# Patient Record
Sex: Male | Born: 1990
Health system: Southern US, Community
[De-identification: ages and names within clinical notes are randomized; demographics above are authoritative.]

---
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
## 2012-03-19 ENCOUNTER — Encounter (HOSPITAL_COMMUNITY): Payer: Self-pay | Admitting: *Deleted

## 2012-03-19 DIAGNOSIS — Z0389 Encounter for observation for other suspected diseases and conditions ruled out: Secondary | ICD-10-CM | POA: Insufficient documentation

## 2012-03-19 DIAGNOSIS — R3919 Other difficulties with micturition: Secondary | ICD-10-CM | POA: Insufficient documentation

## 2012-03-19 NOTE — ED Notes (Signed)
Pt states he has seen blood in urine for past 4 days, voids red in the morning after waking up and urine changes to yellow color throughout the day.  Denies urinary pain, foul odor, abdominal pain, n/v/d.

## 2012-03-20 ENCOUNTER — Emergency Department (HOSPITAL_COMMUNITY)
Admission: EM | Admit: 2012-03-20 | Discharge: 2012-03-20 | Disposition: A | Discharge status: Home / Self Care | Payer: Medicaid Other | Attending: Emergency Medicine | Admitting: Emergency Medicine

## 2012-03-20 DIAGNOSIS — Z711 Person with feared health complaint in whom no diagnosis is made: Secondary | ICD-10-CM

## 2012-03-20 LAB — URINALYSIS, ROUTINE W REFLEX MICROSCOPIC
Ketones, ur: NEGATIVE mg/dL
Nitrite: NEGATIVE
Protein, ur: NEGATIVE mg/dL
Specific Gravity, Urine: 1.007 (ref 1.005–1.030)
Urobilinogen, UA: 1 mg/dL (ref 0.0–1.0)
pH: 7.5 (ref 5.0–8.0)

## 2012-03-20 NOTE — ED Provider Notes (Signed)
History     CSN: [id number]  Arrival date & time 03/19/12  2325   First MD Initiated Contact with Patient 03/20/12 0038      Chief Complaint  Patient presents with  . Hematuria    (Consider location/radiation/quality/duration/timing/severity/associated sxs/prior treatment) Patient is a 21 y.o. male presenting with hematuria. The history is provided by the patient.  Hematuria  Patient presents to ER complaining of a few day hx of either dark orange colored urine when waking or  waking with blood in urine with first urination. However patient states that urine becomes lighter throughout the day with drinking fluids. He denies any associated pain denying flank pain, abdominal pain, dysuria, penile pain, penile d/c or testicular pain. Patient states he is not sexually active. Has no known medical problems and takes no meds on regular basis. Denies fevers, chills. Denies aggravating or alleviating factors. Denies hx of similar symptoms.   History reviewed. No pertinent past medical history.  History reviewed. No pertinent past surgical history.  History reviewed. No pertinent family history.  History  Substance Use Topics  . Smoking status: Never Smoker   . Smokeless tobacco: Not on file  . Alcohol Use: No      Review of Systems  Genitourinary: Positive for hematuria.  All other systems reviewed and are negative.    Allergies  Review of patient's allergies indicates no known allergies.  Home Medications   Current Outpatient Rx  Name Route Sig Dispense Refill  . IBUPROFEN 200 MG PO TABS Oral Take 400-600 mg by mouth every 6 (six) hours as needed. For pain      BP 145/46  Pulse 68  Temp 98.7 F (37.1 C) (Oral)  Resp 16  SpO2 100%  Physical Exam  Nursing note and vitals reviewed. Constitutional: He is oriented to person, place, and time. He appears well-developed and well-nourished. No distress.  HENT:  Head: Normocephalic and atraumatic.  Eyes: Conjunctivae are  normal.  Cardiovascular: Normal rate, regular rhythm, normal heart sounds and intact distal pulses.  Exam reveals no gallop and no friction rub.   No murmur heard. Pulmonary/Chest: Effort normal and breath sounds normal. No respiratory distress. He has no wheezes. He has no rales. He exhibits no tenderness.  Abdominal: Soft. Bowel sounds are normal. He exhibits no distension and no mass. There is no tenderness. There is no rebound and no guarding.       No CVA TTP.   Genitourinary: Penis normal. No penile tenderness.       No teste pain with palpation  Neurological: He is alert and oriented to person, place, and time.  Skin: Skin is warm and dry. No rash noted. He is not diaphoretic.  Psychiatric: He has a normal mood and affect.    ED Course  Procedures (including critical care time)   Labs Reviewed  URINALYSIS, ROUTINE W REFLEX MICROSCOPIC   No results found.   1. Physically well but worried       MDM  Young healthy male who is unsure whether he is seeing highly concentrated urine upon waking over the last three days or has blood in urine but UA shows no evidence of blood. He has no other complaints denying any associated pain making kidney stones of low likelihood. Gave urology follow up for further evaluation if he has ongoing concern for color of urine vs hematuria but I can only assume he is seeing more concentrated urine in the mornings. Afebrile. Abdomen soft and non tender with no  complaints of penile d/c, penile pain, or teste pain.         [place], [place] 03/20/12 [phone]

## 2012-03-20 NOTE — ED Provider Notes (Signed)
Medical screening examination/treatment/procedure(s) were performed by non-physician practitioner and as supervising physician I was immediately available for consultation/collaboration.  Olivia Mackie, MD 03/20/12 [phone]

## 2012-03-20 NOTE — ED Notes (Signed)
Pt discharged home in good condition. 

## 2012-07-13 ENCOUNTER — Encounter (HOSPITAL_COMMUNITY): Payer: Self-pay | Admitting: Emergency Medicine

## 2012-07-13 ENCOUNTER — Emergency Department (HOSPITAL_COMMUNITY): Payer: Medicaid Other

## 2012-07-13 ENCOUNTER — Emergency Department (HOSPITAL_COMMUNITY)
Admission: EM | Admit: 2012-07-13 | Discharge: 2012-07-13 | Disposition: A | Discharge status: Home / Self Care | Payer: Medicaid Other | Attending: Emergency Medicine | Admitting: Emergency Medicine

## 2012-07-13 DIAGNOSIS — K8020 Calculus of gallbladder without cholecystitis without obstruction: Secondary | ICD-10-CM | POA: Insufficient documentation

## 2012-07-13 DIAGNOSIS — I951 Orthostatic hypotension: Secondary | ICD-10-CM | POA: Insufficient documentation

## 2012-07-13 DIAGNOSIS — J069 Acute upper respiratory infection, unspecified: Secondary | ICD-10-CM | POA: Insufficient documentation

## 2012-07-13 LAB — CBC WITH DIFFERENTIAL/PLATELET
Basophils Absolute: 0 10*3/uL (ref 0.0–0.1)
Basophils Relative: 0 % (ref 0–1)
Eosinophils Absolute: 0 10*3/uL (ref 0.0–0.7)
HCT: 43.4 % (ref 39.0–52.0)
MCH: 29.4 pg (ref 26.0–34.0)
RBC: 5.3 MIL/uL (ref 4.22–5.81)
RDW: 12.2 % (ref 11.5–15.5)
WBC: 6.3 10*3/uL (ref 4.0–10.5)

## 2012-07-13 LAB — URINALYSIS, ROUTINE W REFLEX MICROSCOPIC
Bilirubin Urine: NEGATIVE
Ketones, ur: NEGATIVE mg/dL
Leukocytes, UA: NEGATIVE
Protein, ur: NEGATIVE mg/dL
Specific Gravity, Urine: 1.015 (ref 1.005–1.030)
pH: 6 (ref 5.0–8.0)

## 2012-07-13 LAB — COMPREHENSIVE METABOLIC PANEL
ALT: 21 U/L (ref 0–53)
AST: 22 U/L (ref 0–37)
Alkaline Phosphatase: 72 U/L (ref 39–117)
CO2: 25 mEq/L (ref 19–32)
Chloride: 101 mEq/L (ref 96–112)
GFR calc Af Amer: >90 mL/min (ref >90)
GFR calc non Af Amer: 89 mL/min — ABNORMAL LOW (ref >90)
Glucose, Bld: 101 mg/dL — ABNORMAL HIGH (ref 70–99)
Potassium: 4 mEq/L (ref 3.5–5.1)
Sodium: 137 mEq/L (ref 135–145)
Total Bilirubin: 0.4 mg/dL (ref 0.3–1.2)

## 2012-07-13 LAB — LIPASE, BLOOD: Lipase: 48 U/L (ref 11–59)

## 2012-07-13 MED ORDER — SODIUM CHLORIDE 0.9 % IV BOLUS (SEPSIS)
1000.0000 mL | Freq: Once | INTRAVENOUS | Status: DC
  Administered 2012-07-13: 1000 mL via INTRAVENOUS

## 2012-07-13 MED ORDER — SODIUM CHLORIDE 0.9 % IV BOLUS (SEPSIS)
1000.0000 mL | Freq: Once | INTRAVENOUS | Status: AC
  Administered 2012-07-13: 1000 mL via INTRAVENOUS

## 2012-07-13 MED ORDER — AZITHROMYCIN 250 MG PO TABS: 250.0000 mg | ORAL_TABLET | Freq: Every day | ORAL | Status: DC

## 2012-07-13 MED ORDER — BENZONATATE 100 MG PO CAPS: 100.0000 mg | ORAL_CAPSULE | Freq: Three times a day (TID) | ORAL | Status: DC

## 2012-07-13 NOTE — ED Provider Notes (Signed)
History     CSN: [id number]  Arrival date & time 07/13/12  4098   First MD Initiated Contact with Patient 07/13/12 [phone]      Chief Complaint  Patient presents with  . Abdominal Pain    (Consider location/radiation/quality/duration/timing/severity/associated sxs/prior treatment) HPI  Pt to the ER by dad for abdominal pain that was acute onset starting this morning. He admits that by arriving to the ER he is no longer having abdominal pain but tells me he feels really dizzy even lying down still. He has not eaten this morning. He denies having any chest pain but has had a cough and fevers. His younger brother was recently diagnosed with pneumonia. Pt is nad but appears to be dry on exam.  History reviewed. No pertinent past medical history.  History reviewed. No pertinent past surgical history.  No family history on file.  History  Substance Use Topics  . Smoking status: Never Smoker   . Smokeless tobacco: Not on file  . Alcohol Use: No      Review of Systems  Review of Systems  Gen: no weight loss, chills, night sweats , +  fevers Eyes: no discharge or drainage, no occular pain or visual changes  Nose: no epistaxis or rhinorrhea  Mouth: no dental pain, no sore throat  Neck: no neck pain  Lungs:No wheezing,  or hemoptysis +coughing CV: no chest pain, palpitations, dependent edema or orthopnea  Abd: +abdominal pain, no nausea, vomiting  GU: no dysuria or gross hematuria  MSK:  No abnormalities  Neuro: no headache, no focal neurologic deficits  Skin: no abnormalities Psyche: negative.   Allergies  Review of patient's allergies indicates no known allergies.  Home Medications   Current Outpatient Rx  Name  Route  Sig  Dispense  Refill  . IBUPROFEN 200 MG PO TABS   Oral   Take 400 mg by mouth every 6 (six) hours as needed. For pain           BP 140/71  Pulse 88  Temp 100.4 F (38 C) (Rectal)  Resp 24  Wt 210 lb (95.255 kg)  SpO2 98%  Physical Exam    Nursing note and vitals reviewed. Constitutional: He appears well-developed and well-nourished. No distress.  HENT:  Head: Normocephalic and atraumatic.  Eyes: Pupils are equal, round, and reactive to light.  Neck: Normal range of motion. Neck supple.  Cardiovascular: Normal rate and regular rhythm.   Pulmonary/Chest: Effort normal. No respiratory distress. He has wheezes. He has no rales. He exhibits no tenderness.  Abdominal: Soft. He exhibits no distension and no mass. There is no tenderness. There is no rebound and no guarding.  Neurological: He is alert.  Skin: Skin is warm and dry.    ED Course  Procedures (including critical care time)  Labs Reviewed  CBC WITH DIFFERENTIAL - Abnormal; Notable for the following:    Neutrophils Relative 79 (*)     Lymphocytes Relative 11 (*)     All other components within normal limits  COMPREHENSIVE METABOLIC PANEL - Abnormal; Notable for the following:    Glucose, Bld 101 (*)     GFR calc non Af Amer 89 (*)     All other components within normal limits  URINALYSIS, ROUTINE W REFLEX MICROSCOPIC  LIPASE, BLOOD   Dg Chest 2 View  07/13/2012  *RADIOLOGY REPORT*  Clinical Data: Cough and pneumonia  CHEST - 2 VIEW  Comparison: None.  Findings: The heart is upper normal in size  allowing for AP technique with low volumes.  Lungs are under aerated and grossly clear.  No pneumothorax.  No pleural effusion.  IMPRESSION: No active cardiopulmonary disease.   Original Report Authenticated By: Jolaine Click, M.D.    US Abdomen Complete  07/13/2012  *RADIOLOGY REPORT*  Clinical Data:  Epigastric pain  COMPLETE ABDOMINAL ULTRASOUND  Comparison:  None.  Findings:  Gallbladder:  Tiny year gallstones versus gallbladder polyps.  No gallbladder wall thickening or pericholecystic fluid.  Negative sonographic Murphy's sign.  Common bile duct:  Measures 3 mm.  Liver:  No focal lesion identified.  Within normal limits in parenchymal echogenicity.  IVC:  Appears  normal.  Pancreas:  Not visualized due to overlying bowel gas.  Spleen:  Measures 9.7 cm.  Right Kidney:  Measures 13.0 cm.  No mass or hydronephrosis.  Left Kidney:  Measures 13.3 cm.  No mass or hydronephrosis.  Abdominal aorta:  No aneurysm identified.  IMPRESSION: Possible tiny gallstones versus gallbladder polyps.  No evidence of acute cholecystitis.   Original Report Authenticated By: Charline Bills, M.D.      1. Orthostatic hypotension   2. Cholelithiasis   3. URI (upper respiratory infection)       MDM  Discussed results and plan with patient. His orthostatic hypertension resolved with 2L of fluids. Unsure why he was orthostatic, labs are reassuring, abd ultrasound shows small tiny gallbladder stones.  Referral to GenSurg for Cholelithiasis given Azithro and Tessalon perls given for URI  Pt given strict return to ER precautions. Discussed case with Dr. Bernette Mayers prior to dc  Pt has been advised of the symptoms that warrant their return to the ED. Patient has voiced understanding and has agreed to follow-up with the PCP or specialist.         Dorthula Matas, PA 07/13/12 1039

## 2012-07-13 NOTE — ED Notes (Signed)
Pt alert, arrives from home, c/o sharp gen abd pain, onset was this am, pt states he awoke to use restroom with onset, resp even unlabored, skin pwd, denies changes in bowel or bladder

## 2012-07-13 NOTE — ED Notes (Signed)
Returned from xray

## 2012-07-13 NOTE — ED Notes (Signed)
Patient transported to X-ray 

## 2012-07-20 NOTE — ED Provider Notes (Signed)
Medical screening examination/treatment/procedure(s) were performed by non-physician practitioner and as supervising physician I was immediately available for consultation/collaboration.   Shamell Hittle B. Bernette Mayers, MD 07/20/12 1527

## 2013-04-17 ENCOUNTER — Encounter (HOSPITAL_COMMUNITY): Payer: Self-pay | Admitting: *Deleted

## 2013-04-17 ENCOUNTER — Emergency Department (HOSPITAL_COMMUNITY)
Admission: EM | Admit: 2013-04-17 | Discharge: 2013-04-17 | Disposition: A | Discharge status: Home / Self Care | Payer: No Typology Code available for payment source | Attending: Emergency Medicine | Admitting: Emergency Medicine

## 2013-04-17 DIAGNOSIS — R109 Unspecified abdominal pain: Secondary | ICD-10-CM | POA: Insufficient documentation

## 2013-04-17 LAB — CBC WITH DIFFERENTIAL/PLATELET
Basophils Relative: 0 % (ref 0–1)
Hemoglobin: 15.6 g/dL (ref 13.0–17.0)
Lymphocytes Relative: 30 % (ref 12–46)
MCH: 29.5 pg (ref 26.0–34.0)
Monocytes Relative: 8 % (ref 3–12)
Neutro Abs: 4 10*3/uL (ref 1.7–7.7)
Neutrophils Relative %: 59 % (ref 43–77)
Platelets: 191 10*3/uL (ref 150–400)
RDW: 12.2 % (ref 11.5–15.5)

## 2013-04-17 LAB — COMPREHENSIVE METABOLIC PANEL
ALT: 25 U/L (ref 0–53)
BUN: 21 mg/dL (ref 6–23)
Creatinine, Ser: 1.19 mg/dL (ref 0.50–1.35)
GFR calc Af Amer: >90 mL/min (ref >90)
Glucose, Bld: 91 mg/dL (ref 70–99)
Potassium: 3.9 mEq/L (ref 3.5–5.1)
Total Bilirubin: 0.3 mg/dL (ref 0.3–1.2)
Total Protein: 7.3 g/dL (ref 6.0–8.3)

## 2013-04-17 MED ORDER — HYDROCODONE-ACETAMINOPHEN 5-325 MG PO TABS: 2.0000 | ORAL_TABLET | ORAL | Status: DC | PRN

## 2013-04-17 MED ORDER — RANITIDINE HCL 150 MG PO TABS: 150.0000 mg | ORAL_TABLET | Freq: Two times a day (BID) | ORAL | Status: DC

## 2013-04-17 MED ORDER — SODIUM CHLORIDE 0.9 % IV SOLN
INTRAVENOUS | Status: DC
  Administered 2013-04-17: 21:00:00 via INTRAVENOUS

## 2013-04-17 NOTE — ED Provider Notes (Signed)
CSN: [id number]     Arrival date & time 04/17/13  1948 History   First MD Initiated Contact with Patient 04/17/13 2007     Chief Complaint  Patient presents with  . Abdominal Pain   (Consider location/radiation/quality/duration/timing/severity/associated sxs/prior Treatment) HPI Comments: Patient presents to the ER for evaluation of upper abdominal pain. Patient reports that he had an episode 6 or 7 months ago he had severe pain and the ultrasound showed possible gallstones. Since then he has had occasional abdominal pain, but tonight he had severe pain in the upper abdomen radiating up into the chest. Symptoms are improving at time of arrival. He had no nausea or vomiting. There is no fever.  Patient is a 22 y.o. male presenting with abdominal pain.  Abdominal Pain Associated symptoms: no fever     History reviewed. No pertinent past medical history. History reviewed. No pertinent past surgical history. No family history on file. History  Substance Use Topics  . Smoking status: Never Smoker   . Smokeless tobacco: Not on file  . Alcohol Use: No    Review of Systems  Constitutional: Negative for fever.  Gastrointestinal: Positive for abdominal pain.  All other systems reviewed and are negative.    Allergies  Review of patient's allergies indicates no known allergies.  Home Medications  No current outpatient prescriptions on file. BP 118/56  Pulse 58  Temp(Src) 97.9 F (36.6 C) (Oral)  Resp 16  SpO2 99% Physical Exam  Constitutional: He is oriented to person, place, and time. He appears well-developed and well-nourished. No distress.  HENT:  Head: Normocephalic and atraumatic.  Right Ear: Hearing normal.  Left Ear: Hearing normal.  Nose: Nose normal.  Mouth/Throat: Oropharynx is clear and moist and mucous membranes are normal.  Eyes: Conjunctivae and EOM are normal. Pupils are equal, round, and reactive to light.  Neck: Normal range of motion. Neck supple.   Cardiovascular: Regular rhythm, S1 normal and S2 normal.  Exam reveals no gallop and no friction rub.   No murmur heard. Pulmonary/Chest: Effort normal and breath sounds normal. No respiratory distress. He exhibits no tenderness.  Abdominal: Soft. Normal appearance and bowel sounds are normal. There is no hepatosplenomegaly. There is no tenderness. There is no rebound, no guarding, no tenderness at McBurney's point and negative Murphy's sign. No hernia.  Musculoskeletal: Normal range of motion.  Neurological: He is alert and oriented to person, place, and time. He has normal strength. No cranial nerve deficit or sensory deficit. Coordination normal. GCS eye subscore is 4. GCS verbal subscore is 5. GCS motor subscore is 6.  Skin: Skin is warm, dry and intact. No rash noted. No cyanosis.  Psychiatric: He has a normal mood and affect. His speech is normal and behavior is normal. Thought content normal.    ED Course  Procedures (including critical care time) Labs Review Labs Reviewed  COMPREHENSIVE METABOLIC PANEL - Abnormal; Notable for the following:    GFR calc non Af Amer 87 (*)    All other components within normal limits  CBC WITH DIFFERENTIAL  LIPASE, BLOOD   Imaging Review   MDM  Diagnosis: Abdominal pain  Patient presents to the ER for evaluation of abdominal pain. I did the patient's records, he did have possible tiny gallstones or gallbladder polyp on previous ultrasound. I did not repeat his ultrasound because he is now pain-free and his labs are normal. I suspect this is more reflux than gallbladder disease. Patient will be treated for reflux and is  referred to General surgery.    Gilda Crease, MD 04/17/13 [phone]

## 2013-04-17 NOTE — ED Notes (Signed)
Pt c/o upper abd pain off and on x 6 months; states was seen six months ago and told he had gallbladder problems; states pain has returned today; intense; denies n/v; denies fever

## 2013-05-03 ENCOUNTER — Ambulatory Visit: Payer: No Typology Code available for payment source | Attending: Internal Medicine

## 2013-08-02 ENCOUNTER — Encounter (HOSPITAL_COMMUNITY): Payer: Self-pay | Admitting: Emergency Medicine

## 2013-08-02 ENCOUNTER — Emergency Department (HOSPITAL_COMMUNITY)
Admission: EM | Admit: 2013-08-02 | Discharge: 2013-08-02 | Disposition: A | Discharge status: Home / Self Care | Payer: No Typology Code available for payment source | Attending: Emergency Medicine | Admitting: Emergency Medicine

## 2013-08-02 DIAGNOSIS — R51 Headache: Secondary | ICD-10-CM | POA: Insufficient documentation

## 2013-08-02 MED ORDER — METOCLOPRAMIDE HCL 5 MG/ML IJ SOLN
10.0000 mg | Freq: Once | INTRAMUSCULAR | Status: AC
  Administered 2013-08-02: 10 mg via INTRAVENOUS
  Filled 2013-08-02: qty 2

## 2013-08-02 MED ORDER — DIPHENHYDRAMINE HCL 50 MG/ML IJ SOLN
25.0000 mg | Freq: Once | INTRAMUSCULAR | Status: AC
  Administered 2013-08-02: 25 mg via INTRAVENOUS
  Filled 2013-08-02: qty 1

## 2013-08-02 NOTE — ED Provider Notes (Signed)
CSN: [id number]     Arrival date & time 08/02/13  1346 History   First MD Initiated Contact with Patient 08/02/13 1524     Chief Complaint  Patient presents with  . Headache    Patient is a 22 y.o. male presenting with headaches. The history is provided by the patient.  Headache Pain location:  Frontal Quality:  Dull Severity currently:  8/10 Onset quality:  Gradual Duration:  3 days Timing:  Constant Progression:  Unchanged Relieved by:  Nothing Worsened by:  Nothing tried Associated symptoms: no blurred vision, no fever, no focal weakness, no vomiting and no weakness   pt denies head trauma No fever No rash No tick bites No travel No focal weakness No one else at home has these symptoms  He reports headache started 3 days ago.  It has been constant.  It started out about an 8/10.  It has gradually increased to 10/10 now improved back to 8/10 He has no h/o headaches He reports stress and lack of sleep as well   PMH - none Soc hx - negative for drug use fam hx - negative for premature stroke History  Substance Use Topics  . Smoking status: Never Smoker   . Smokeless tobacco: Not on file  . Alcohol Use: No    Review of Systems  Constitutional: Negative for fever.  Eyes: Negative for blurred vision.  Gastrointestinal: Negative for vomiting.  Neurological: Positive for headaches. Negative for focal weakness and weakness.  All other systems reviewed and are negative.    Allergies  Review of patient's allergies indicates no known allergies.  Home Medications   Current Outpatient Rx  Name  Route  Sig  Dispense  Refill  . ibuprofen (ADVIL,MOTRIN) 200 MG tablet   Oral   Take 400 mg by mouth every 6 (six) hours as needed.          BP 125/66  Pulse 54  Temp(Src) 98.1 F (36.7 C) (Oral)  Resp 16  Ht 5\' 8"  (1.727 m)  Wt 199 lb 1 oz (90.294 kg)  BMI 30.27 kg/m2  SpO2 98% Physical Exam CONSTITUTIONAL: Well developed/well nourished HEAD:  Normocephalic/atraumatic, no bruising or signs of trauma EYES: EOMI/PERRL, no nystagmus, normal fundoscopic exam  ENMT: Mucous membranes moist NECK: supple no meningeal signs, no bruits SPINE:entire spine nontender CV: S1/S2 noted, no murmurs/rubs/gallops noted LUNGS: Lungs are clear to auscultation bilaterally, no apparent distress ABDOMEN: soft, nontender, no rebound or guarding GU:no cva tenderness NEURO:Awake/alert, facies symmetric, no arm or leg drift is noted Cranial nerves 3/4/5/6/02/27/09/11/12 tested and intact Gait normal without ataxia No past pointing EXTREMITIES: pulses normal, full ROM SKIN: warm, color normal PSYCH: no abnormalities of mood noted   ED Course  Procedures (including critical care time) Labs Review Labs Reviewed - No data to display Imaging Review No results found.  EKG Interpretation   None       MDM  No diagnosis found. Nursing notes including past medical history and social history reviewed and considered in documentation  Pt is very well appearing, no distress, reports only frontal headache, suspicion for [facility] or other acute neurologic event is low.  He agrees to receive meds for his headache.   4:29 PM Pt improved He would like to go home We discussed strict return precautions   Joya Gaskins, MD 08/02/13 1630

## 2013-08-02 NOTE — ED Notes (Signed)
Pt states that he's had a headache x 3 days that won't go away.  Pt states he's taken Advil with no relief.

## 2013-08-02 NOTE — ED Notes (Signed)
MD at bedside. EDP [place] 

## 2013-12-01 ENCOUNTER — Emergency Department (HOSPITAL_COMMUNITY)
Admission: EM | Admit: 2013-12-01 | Discharge: 2013-12-01 | Disposition: A | Discharge status: Home / Self Care | Payer: No Typology Code available for payment source | Attending: Emergency Medicine | Admitting: Emergency Medicine

## 2013-12-01 ENCOUNTER — Encounter (HOSPITAL_COMMUNITY): Payer: Self-pay | Admitting: Emergency Medicine

## 2013-12-01 ENCOUNTER — Emergency Department (HOSPITAL_COMMUNITY): Payer: No Typology Code available for payment source

## 2013-12-01 DIAGNOSIS — R109 Unspecified abdominal pain: Secondary | ICD-10-CM | POA: Insufficient documentation

## 2013-12-01 LAB — URINALYSIS, ROUTINE W REFLEX MICROSCOPIC
Bilirubin Urine: NEGATIVE
Glucose, UA: NEGATIVE mg/dL
Hgb urine dipstick: NEGATIVE
KETONES UR: NEGATIVE mg/dL
LEUKOCYTES UA: NEGATIVE
Nitrite: NEGATIVE
PH: 6 (ref 5.0–8.0)
Protein, ur: NEGATIVE mg/dL
Specific Gravity, Urine: 1.025 (ref 1.005–1.030)
Urobilinogen, UA: 0.2 mg/dL (ref 0.0–1.0)

## 2013-12-01 LAB — CBC WITH DIFFERENTIAL/PLATELET
BASOS PCT: 0 % (ref 0–1)
Basophils Absolute: 0 10*3/uL (ref 0.0–0.1)
Eosinophils Absolute: 0.1 10*3/uL (ref 0.0–0.7)
Eosinophils Relative: 1 % (ref 0–5)
HCT: 44.9 % (ref 39.0–52.0)
Hemoglobin: 16.2 g/dL (ref 13.0–17.0)
Lymphocytes Relative: 24 % (ref 12–46)
Lymphs Abs: 1.3 10*3/uL (ref 0.7–4.0)
MCH: 30.5 pg (ref 26.0–34.0)
MCHC: 36.1 g/dL — AB (ref 30.0–36.0)
MCV: 84.4 fL (ref 78.0–100.0)
Monocytes Absolute: 0.5 10*3/uL (ref 0.1–1.0)
Monocytes Relative: 9 % (ref 3–12)
NEUTROS ABS: 3.6 10*3/uL (ref 1.7–7.7)
NEUTROS PCT: 66 % (ref 43–77)
PLATELETS: 183 10*3/uL (ref 150–400)
RBC: 5.32 MIL/uL (ref 4.22–5.81)
RDW: 11.9 % (ref 11.5–15.5)
WBC: 5.5 10*3/uL (ref 4.0–10.5)

## 2013-12-01 LAB — COMPREHENSIVE METABOLIC PANEL
ALBUMIN: 4.3 g/dL (ref 3.5–5.2)
ALK PHOS: 65 U/L (ref 39–117)
ALT: 23 U/L (ref 0–53)
AST: 24 U/L (ref 0–37)
BILIRUBIN TOTAL: 0.5 mg/dL (ref 0.3–1.2)
BUN: 23 mg/dL (ref 6–23)
CHLORIDE: 103 meq/L (ref 96–112)
CO2: 26 mEq/L (ref 19–32)
Calcium: 9.8 mg/dL (ref 8.4–10.5)
Creatinine, Ser: 0.93 mg/dL (ref 0.50–1.35)
GFR calc Af Amer: >90 mL/min (ref >90)
GFR calc non Af Amer: >90 mL/min (ref >90)
Glucose, Bld: 127 mg/dL — ABNORMAL HIGH (ref 70–99)
POTASSIUM: 3.9 meq/L (ref 3.7–5.3)
SODIUM: 142 meq/L (ref 137–147)
Total Protein: 7.3 g/dL (ref 6.0–8.3)

## 2013-12-01 LAB — LIPASE, BLOOD: Lipase: 63 U/L — ABNORMAL HIGH (ref 11–59)

## 2013-12-01 MED ORDER — TRAMADOL HCL 50 MG PO TABS: 50.0000 mg | ORAL_TABLET | Freq: Four times a day (QID) | ORAL | Status: DC | PRN

## 2013-12-01 MED ORDER — CYCLOBENZAPRINE HCL 10 MG PO TABS: 10.0000 mg | ORAL_TABLET | Freq: Two times a day (BID) | ORAL | Status: DC | PRN

## 2013-12-01 NOTE — ED Provider Notes (Signed)
CSN: [id number]     Arrival date & time 12/01/13  1236 History   First MD Initiated Contact with Patient 12/01/13 1257     Chief Complaint  Patient presents with  . Flank Pain     (Consider location/radiation/quality/duration/timing/severity/associated sxs/prior Treatment) HPI Comments: Patient is a 23 year old male with no past medical history who presents with right flank pain. The pain is located in his right flank and radiates around to her RLQ. The pain is described as aching and severe. The pain started gradually and progressively worsened since the onset. The pain is worse with movement. No alleviating factors. The patient has tried nothing for symptoms without relief. Associated symptoms include nothing. Patient denies fever, headache, NVD, chest pain, SOB, dysuria, constipation, abnormal vaginal bleeding/discharge.     Patient is a 23 y.o. male presenting with flank pain.  Flank Pain Pertinent negatives include no abdominal pain, arthralgias, chest pain, chills, fatigue, fever, nausea, neck pain, vomiting or weakness.    History reviewed. No pertinent past medical history. No past surgical history on file. No family history on file. History  Substance Use Topics  . Smoking status: Never Smoker   . Smokeless tobacco: Not on file  . Alcohol Use: No    Review of Systems  Constitutional: Negative for fever, chills and fatigue.  HENT: Negative for trouble swallowing.   Eyes: Negative for visual disturbance.  Respiratory: Negative for shortness of breath.   Cardiovascular: Negative for chest pain and palpitations.  Gastrointestinal: Negative for nausea, vomiting, abdominal pain and diarrhea.  Genitourinary: Positive for flank pain. Negative for dysuria and difficulty urinating.  Musculoskeletal: Negative for arthralgias and neck pain.  Skin: Negative for color change.  Neurological: Negative for dizziness and weakness.  Psychiatric/Behavioral: Negative for dysphoric mood.       Allergies  Review of patient's allergies indicates no known allergies.  Home Medications  No current outpatient prescriptions on file. BP 132/54  Pulse 51  Temp(Src) 98.2 F (36.8 C) (Oral)  Resp 16  SpO2 100% Physical Exam  Nursing note and vitals reviewed. Constitutional: He appears well-developed and well-nourished. No distress.  HENT:  Head: Normocephalic and atraumatic.  Eyes: Conjunctivae and EOM are normal.  Neck: Normal range of motion.  Cardiovascular: Normal rate and regular rhythm.  Exam reveals no gallop and no friction rub.   No murmur heard. Pulmonary/Chest: Effort normal and breath sounds normal. He has no wheezes. He has no rales. He exhibits no tenderness.  Abdominal: Soft. He exhibits no distension. There is no tenderness. There is no rebound and no guarding.  No tenderness to palpation or peritoneal signs.   Genitourinary:  No CVA tenderness to palpation.   Musculoskeletal: Normal range of motion.  Neurological: He is alert.  Speech is goal-oriented. Moves limbs without ataxia.   Skin: Skin is warm and dry.  Psychiatric: He has a normal mood and affect. His behavior is normal.    ED Course  Procedures (including critical care time) Labs Review Labs Reviewed  CBC WITH DIFFERENTIAL - Abnormal; Notable for the following:    MCHC 36.1 (*)    All other components within normal limits  COMPREHENSIVE METABOLIC PANEL - Abnormal; Notable for the following:    Glucose, Bld 127 (*)    All other components within normal limits  LIPASE, BLOOD - Abnormal; Notable for the following:    Lipase 63 (*)    All other components within normal limits  URINALYSIS, ROUTINE W REFLEX MICROSCOPIC   Imaging  Review Ct Abdomen Pelvis Wo Contrast  12/01/2013   CLINICAL DATA:  Right-sided flank pain.  Known gallstones.  EXAM: CT ABDOMEN AND PELVIS WITHOUT CONTRAST  TECHNIQUE: Multidetector CT imaging of the abdomen and pelvis was performed following the standard protocol  without intravenous contrast.  COMPARISON:  US ABDOMEN COMPLETE dated 07/13/2012  FINDINGS: Lung bases are clear.  Gallstones not visualized, and could be radiolucent. Allowing for lack of contrast and CT technique, the liver, gallbladder, kidneys, adrenal glands, spleen, and pancreas appear normal. No ascites, free air, or lymphadenopathy.  No radiopaque renal, ureteral, or bladder calculus. No bowel wall thickening or focal segmental dilatation. Normal appendix. There is focal skin thickening of the left anterior abdominal wall image 79 just superior to the mons pubis. No subcutaneous collection is identified.  No acute osseous abnormality.  IMPRESSION: No acute intra-abdominal or pelvic pathology. Specifically, no radiopaque renal or ureteral calculus.  Superficial focal skin thickening on the left just above the level of the mons pubis. Correlate clinically for any evidence of focal cellulitis or skin lesion.   Electronically Signed   By: Christiana Pellant M.D.   On: 12/01/2013 15:43     EKG Interpretation None      MDM   Final diagnoses:  Right flank pain    3:39 PM Labs and urinalysis unremarkable for acute changes. Vitals stable and patient afebrile. Patient declines pain medication. Patient will have noncontrast CT abdomen pelvis.   3:50 PM CT shows no acute changes to account for patient's pain. Patient most likely experiencing muscle strain. Vitals stable and patient afebrile. Patient will be discharged with tramadol and flexeril for pain. Patient advised to follow up with PCP from the resource guide.   Emilia Beck, PA-C 12/01/13 1551

## 2013-12-01 NOTE — ED Notes (Signed)
He c/o right flank area pain which began this morning.  He states he has known cholelithiasis and was told that when he hurts like this it is "probably gallbladder trouble".  He denies fever/dysuria, nor any other sign of recent illness.

## 2013-12-01 NOTE — ED Provider Notes (Signed)
Medical screening examination/treatment/procedure(s) were performed by non-physician practitioner and as supervising physician I was immediately available for consultation/collaboration.   EKG Interpretation None        Stephie Xu N Kimberlynn Lumbra, DO 12/01/13 1731 

## 2013-12-01 NOTE — Discharge Instructions (Signed)
Take Tramadol as needed for pain. Take Flexeril as needed for muscle spasm. Follow up with a primary care provider from the resource guide below. Refer to attached documents for more information.    Emergency Department Resource Guide 1) Find a Doctor and Pay Out of Pocket Although you won't have to find out who is covered by your insurance plan, it is a good idea to ask around and get recommendations. You will then need to call the office and see if the doctor you have chosen will accept you as a new patient and what types of options they offer for patients who are self-pay. Some doctors offer discounts or will set up payment plans for their patients who do not have insurance, but you will need to ask so you aren't surprised when you get to your appointment.  2) Contact Your Local Health Department Not all health departments have doctors that can see patients for sick visits, but many do, so it is worth a call to see if yours does. If you don't know where your local health department is, you can check in your phone book. The CDC also has a tool to help you locate your state's health department, and many state websites also have listings of all of their local health departments.  3) Find a Walk-in Clinic If your illness is not likely to be very severe or complicated, you may want to try a walk in clinic. These are popping up all over the country in pharmacies, drugstores, and shopping centers. They're usually staffed by nurse practitioners or physician assistants that have been trained to treat common illnesses and complaints. They're usually fairly quick and inexpensive. However, if you have serious medical issues or chronic medical problems, these are probably not your best option.  No Primary Care Doctor: - Call Health Connect at  [phone] - they can help you locate a primary care doctor that  accepts your insurance, provides certain services, etc. - Physician Referral Service-  [phone]  Chronic Pain Problems: Organization         Address  Phone   Notes  [facility]  [phone] Patients need to be referred by their primary care doctor.   Medication Assistance: Organization         Address  Phone   Notes  [facility] [address] [place], [place] [address] [phone] --Must be a resident of [facility] -- Must have NO insurance coverage whatsoever (no Medicaid/ Medicare, etc.) -- The pt. MUST have a primary care doctor that directs their care regularly and follows them in the community   MedAssist  [phone]   [employer]  [phone]    Agencies that provide inexpensive medical care: Organization         Address  Phone   Notes  [facility]  [phone]   [facility]    [phone]   [facility] [address] [place], [place] [address] [phone]   [facility] [address], [place] [phone]) [phone]   [facility]anned Parenthood    [phone]   [facility]    [phone]   [facility]  [address], [place] Phone:  [phone], Fax:  [phone] Hours of Operation:  9 am - 6 pm, M-F.  Also accepts Medicaid/Medicare and self-pay.  [facility]  [place], [address], [place] Phone: [phone], Fax: [phone]. Hours of Operation:  8:30 am - 5:30 pm, M-F.  Also accepts Medicaid and self-pay.  [facility] [address], [place] Phone: [phone]   [place], [place], [place] [phone], Ext. [phone] Mondays & Thursdays: 7-9 AM.  First 15 patients are seen on a first come, first serve basis.    [place] Providers:  Organization         Address  Phone   Notes  [facility] [address],  [place] [phone] Also accepts self-pay patients.  [facility] [address], [place]  [phone]   [place], [address], [place] [phone]   [facility] [address] [place] [phone]   Lucianne Lei [address], [place]   [phone] Only accepts [place] Access [place] patients after they have their name applied to their card.   Self-Pay (no insurance) in [facility]:  Organization         Address  Phone   Notes  Sickle Cell Patients, [facility] [place] [phone]   [facility] [place] [phone]   [facility] [place]  [place], [place], [place] [phone]   [facility]/Dr. Osei-Bonsu  [address], [place] or [address], [place] [phone] Phone number for both [place] and [place] locations is the same.  [facility] [address], [place] [phone]   [facility] [address] [place] or [address] [phone]   [facility] [address] [place] [phone], phone; [phone], fax Sees patients 1st and 3rd Saturday of every month.  Must not qualify for public or private insurance (i.e. Medicaid, Medicare, [place] Health Choice, Veterans' Benefits)  Household income should be no more than 200% of the poverty level The clinic cannot treat you if you are pregnant or think you are pregnant  Sexually transmitted diseases are not treated at the clinic.    Dental Care: Organization         Address  Phone  Notes  [facility] Department of [facility] [place] [phone] Accepts children up to age 50 who are enrolled in  [place] or [place]; pregnant women with a Medicaid card; and children who have applied for Medicaid or [place] Health Choice, but were declined, whose parents can pay a reduced fee at time of service.  [facility] Department of [facility]  [address], [place] [phone] Accepts children up to age 26 who are enrolled in [place] or [place]; pregnant women with a Medicaid card; and children who have applied for Medicaid or [place] Health Choice, but were declined, whose parents can pay a reduced fee at time of service.  [facility] Access PRO[facility]  [place] [phone] Patients are seen by appointment only. Walk-ins are not accepted. [place] will see patients 70 years of age and older. Monday - Tuesday (8am-5pm) Most Wednesdays (8:30-5pm) $30 per visit, cash only  [place] Adult  Dental Access PROGRAM  [address], [facility] [phone] Patients are seen by appointment only. Walk-ins are not accepted. [place] will see patients 42 years of age and older. One Wednesday Evening (Monthly: Volunteer Based).  $30 per visit, cash only  [place]  [phone] for adults; Children under age 54, call Graduate Pediatric Dentistry at [phone]. Children aged 65-14, please call [phone] to request a pediatric application.  Dental services are provided in all areas of dental care including fillings, crowns and bridges, complete and partial dentures, implants, gum treatment, root canals, and extractions. Preventive care is also provided. Treatment is provided to both adults and children. Patients are selected via a lottery and there is often a waiting list.   [facility] [address] [place]  [phone] [email or web address]   [facility] [address] [place], [place] [phone], Ext. [phone] Second and Fourth Thursday of each month, opens at 6:30  AM; Clinic ends at 9 AM.  Patients are seen on a first-come first-served basis, and a limited number are seen during each clinic.   [facility]  [address] Hillard Danker [place], [place] [phone]   Eligibility Requirements You must have lived in [place], [place], or [place] counties for at least the last three months.   You cannot be eligible for state or federal sponsored [employer], including [employer], [place], or [employer].   You generally cannot be eligible for healthcare insurance through your employer.    How to apply: Eligibility screenings are held every Tuesday and Wednesday afternoon from 1:00 pm until 4:00 pm. You do not need an appointment for the interview!  [facility] [address] [place], [place]   [place]  [place] Department  [place]  [phone]    Behavioral Health Resources in the Community: Intensive Outpatient Programs Organization         Address  Phone  Notes  [place]. [address], [place], [place] [phone]   [facility] Outpatient [address], [place], [place]   ADS: Alcohol & Drug Svcs [address] [place], [place]   [place] [address]  [place], [place] or [phone]   Substance Abuse Resources Organization         Address  Phone  Notes  Alcohol and Drug Services  [phone]   [place]  [phone]   [facility] [place]   Chinita Pester  [phone]   Residential & Outpatient Substance Abuse Program  [phone]   Psychological Services Organization         Address  Phone  Notes  [facility] [place]  [place]  [phone]   [place] [address], [place] [phone] or  [phone]    Mobile Crisis Teams Organization         Address  Phone  Notes  [facility], Mobile Crisis Care Unit  [phone]   [facility]  [address] [place], [place]   [place] Levels [address] [place] [phone]    Self-Help/Support Groups Organization         Address  Phone             Notes  Mental  [facility] [place] - variety of support groups  [place] Call for more information  Narcotics Anonymous (NA), [facility]aring [facility] [address], [employer] [place]  2 meetings at this location   Special educational needs teacher         Address  Phone  Notes  [facility]idential Treatment [place],    [place]  [phone]   [facility]  [address] [place] 748270, [place], [place]   [place], [place] [phone] Admissions: 8am-3pm M-F  Incentives Substance [place] [address],    [place], [place] [phone]   [facility] [address] [place], [place], [place]   [facility] [place] [facility]ually Violent Pred[facility] [address]  [place], [place]   [facility] Programs - Intensive Outpatient [address], [place], [place]   [facility] [place].,  [place], [place] [phone] or [phone]   [facility]idential Treatment Services (RTS) [address] [place], [place] Accepts Medicaid  [facility] [place] [address]  [place] [phone] Substance Abuse/Addiction Treatment   [facility] Organization         Address  Phone  Notes  [facility]  [phone]   Domenic Schwab, PhD [address] [place], [place]   [phone] or [phone]   [place], [place] [phone]   [facility] [address]  [place], [place] [phone] Insurance/Medicaid/sponsorship through [facility] and Families [address]                                    [place], [place] [phone] [place] [address], [place] [phone]    Dr. Adele Schilder  [phone]   [facility] Dept. 1) [address], [place] 2) [place] 3)  [place] [address], [place] [phone]  [phone]   [place] [phone] or [phone] (After Hours)

## 2014-09-10 ENCOUNTER — Ambulatory Visit: Payer: No Typology Code available for payment source

## 2014-11-10 ENCOUNTER — Ambulatory Visit: Payer: 59 | Attending: Internal Medicine

## 2015-07-31 ENCOUNTER — Ambulatory Visit: Payer: 59 | Attending: Internal Medicine

## 2015-08-18 IMAGING — CT CT ABD-PELV W/O CM
1 series · 13 of 16 positions shown, 19 images · non-contrast
Comparison: US ABDOMEN COMPLETE dated 07/13/2012

CLINICAL DATA: Right-sided flank pain.  Known gallstones.

EXAM:
CT ABDOMEN AND PELVIS WITHOUT CONTRAST
TECHNIQUE: Multidetector CT imaging of the abdomen and pelvis was performed
following the standard protocol without intravenous contrast.

[Series 4: lung · axial · 0.72mm/px · z∈[-128,-63]mm · 13 of 16 slices shown, 19 images]
[im 2/16  soft-tissue]
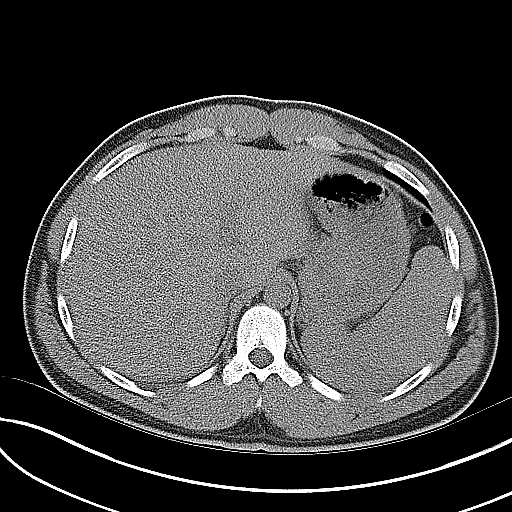
[im 2/16  bone]
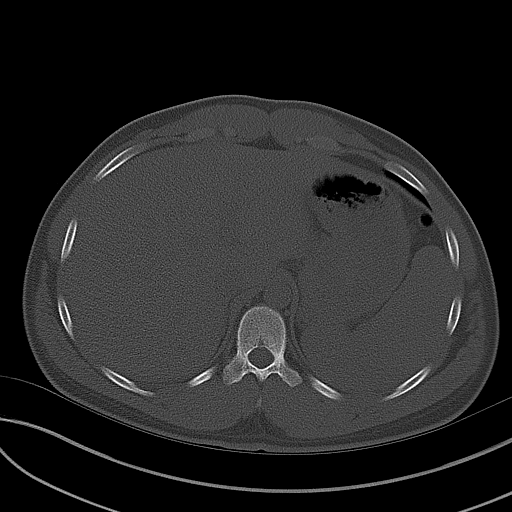
[im 3/16  soft-tissue]
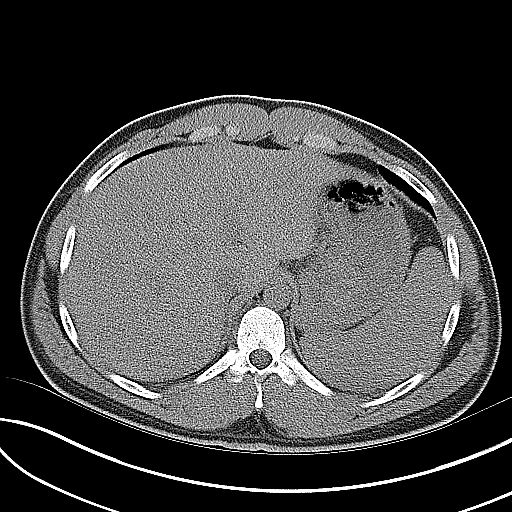
[im 4/16  soft-tissue]
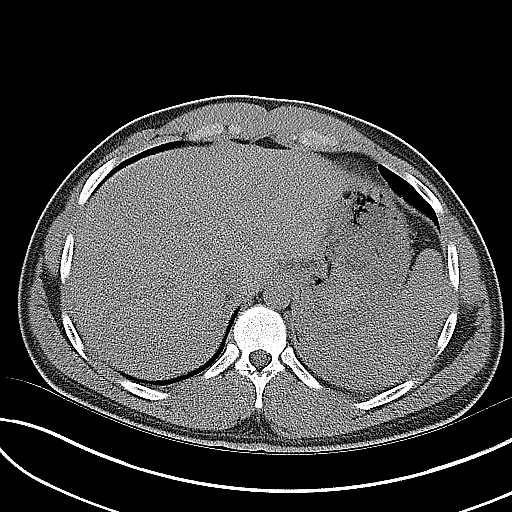
[im 5/16  soft-tissue]
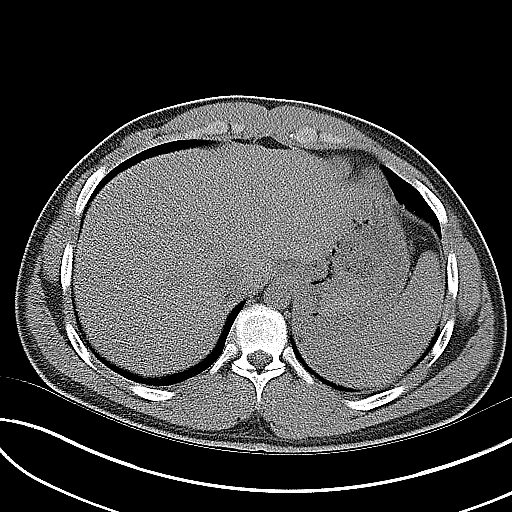
[im 6/16  soft-tissue]
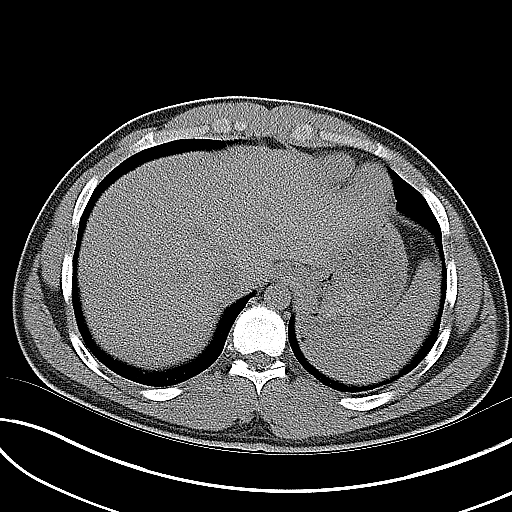
[im 7/16  soft-tissue]
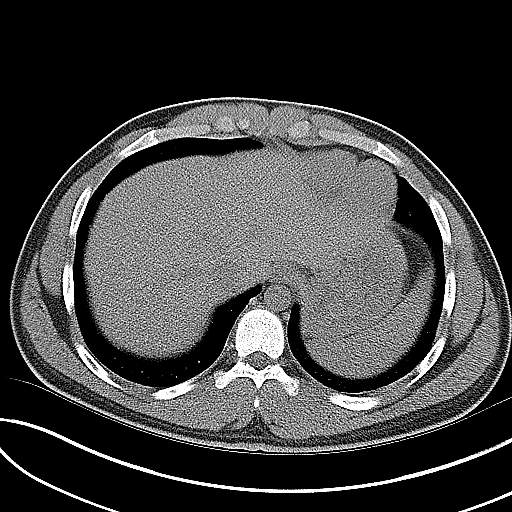
[im 9/16  soft-tissue]
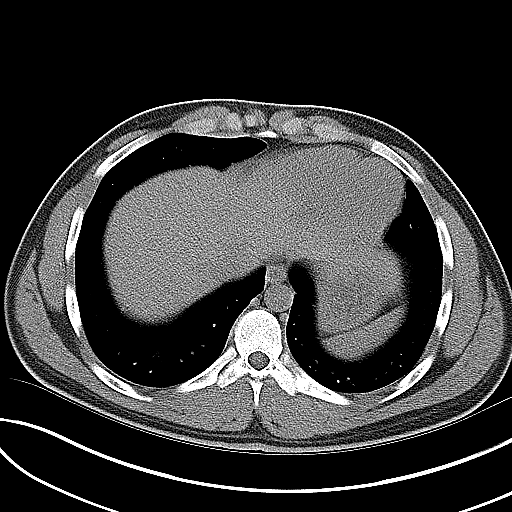
[im 10/16  soft-tissue]
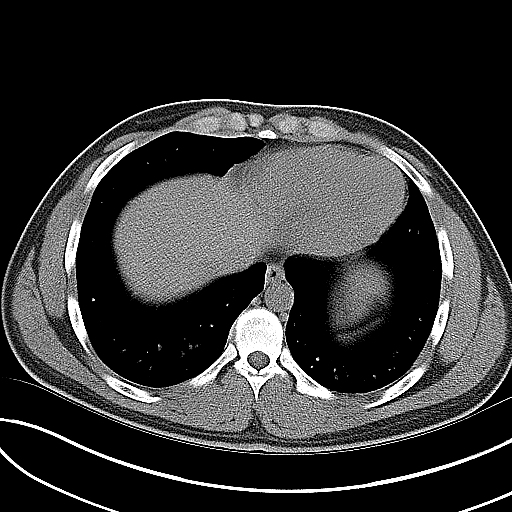
[im 11/16  soft-tissue]
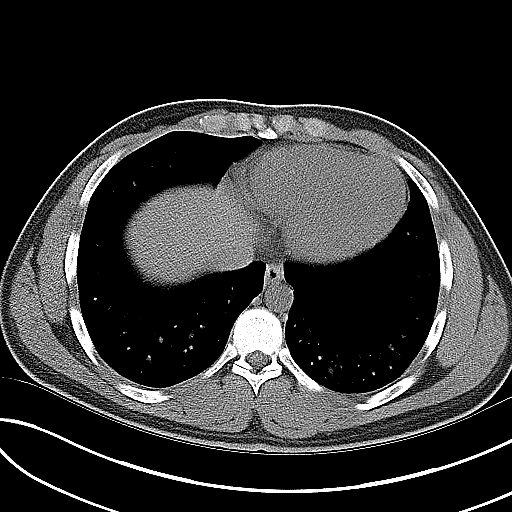
[im 11/16  bone]
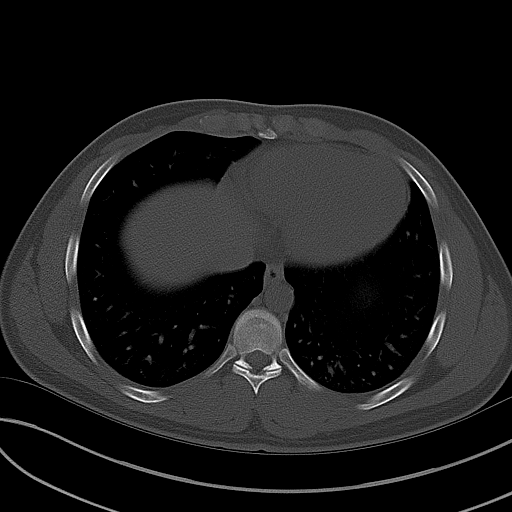
[im 12/16  soft-tissue]
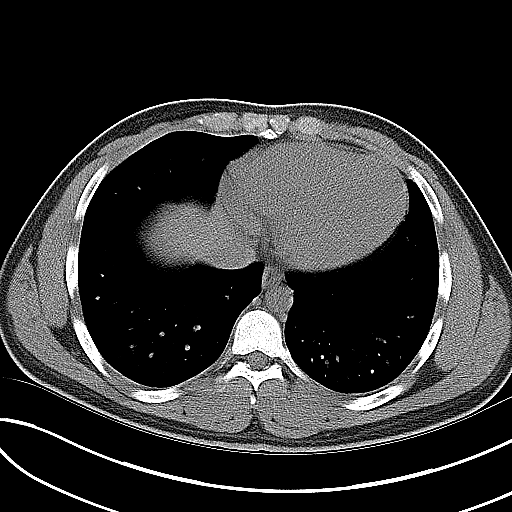
[im 12/16  lung]
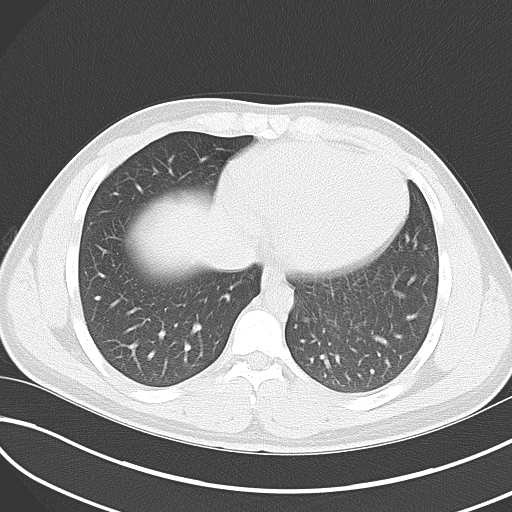
[im 13/16  soft-tissue]
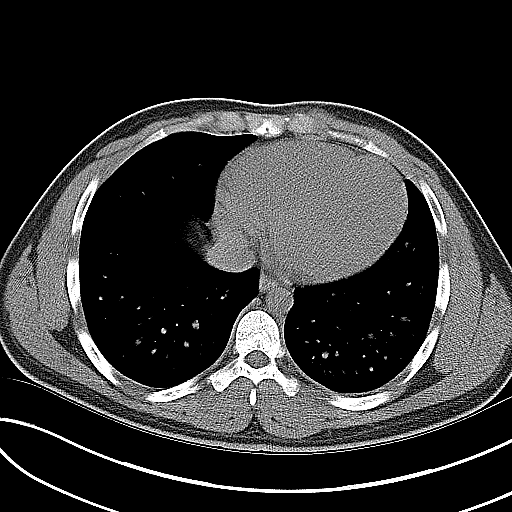
[im 13/16  lung]
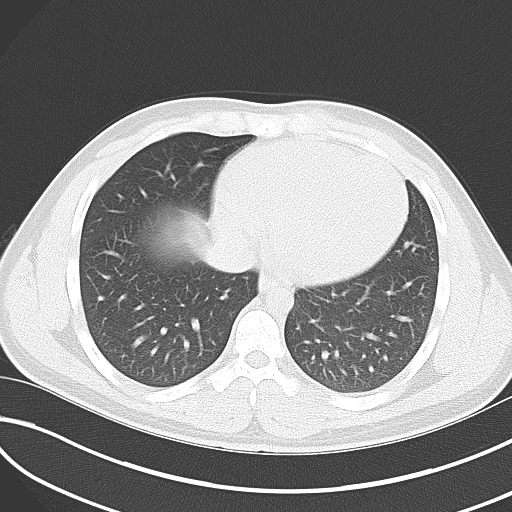
[im 14/16  soft-tissue]
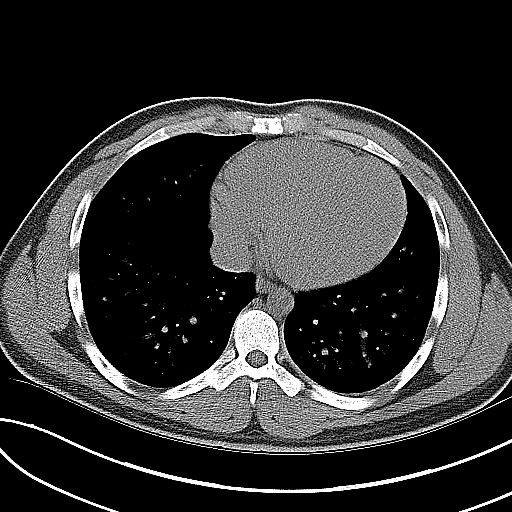
[im 14/16  lung]
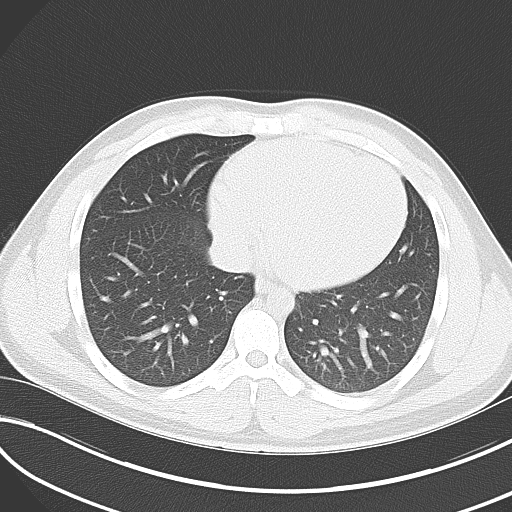
[im 15/16  soft-tissue]
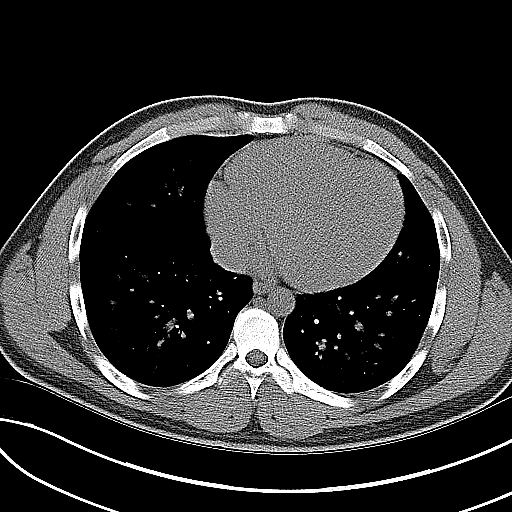
[im 15/16  lung]
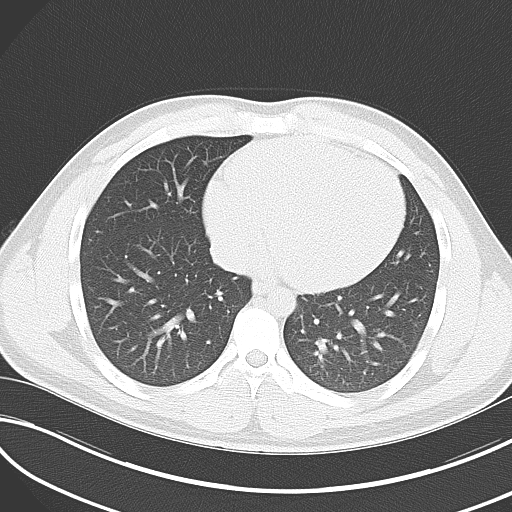

[13 of 16 positions shown; findings below may reference images not displayed]

FINDINGS: Lung bases are clear.

Gallstones not visualized, and could be radiolucent. Allowing for
lack of contrast and CT technique, the liver, gallbladder, kidneys,
adrenal glands, spleen, and pancreas appear normal. No ascites, free
air, or lymphadenopathy.

No radiopaque renal, ureteral, or bladder calculus. No bowel wall
thickening or focal segmental dilatation. Normal appendix. There is
focal skin thickening of the left anterior abdominal wall image 79
just superior to the mons pubis. No subcutaneous collection is
identified.

No acute osseous abnormality.
IMPRESSION: No acute intra-abdominal or pelvic pathology. Specifically, no
radiopaque renal or ureteral calculus.

Superficial focal skin thickening on the left just above the level
of the mons pubis. Correlate clinically for any evidence of focal
cellulitis or skin lesion.

## 2015-09-11 ENCOUNTER — Ambulatory Visit: Payer: No Typology Code available for payment source | Attending: Internal Medicine

## 2016-03-17 ENCOUNTER — Ambulatory Visit: Payer: No Typology Code available for payment source | Attending: Internal Medicine

## 2017-09-22 ENCOUNTER — Ambulatory Visit: Payer: No Typology Code available for payment source | Admitting: Family Medicine

## 2017-09-22 ENCOUNTER — Ambulatory Visit: Payer: No Typology Code available for payment source

## 2019-12-31 ENCOUNTER — Other Ambulatory Visit: Payer: Self-pay

## 2019-12-31 ENCOUNTER — Emergency Department (HOSPITAL_COMMUNITY)
Admission: EM | Admit: 2019-12-31 | Discharge: 2019-12-31 | Disposition: A | Discharge status: Home / Self Care | Payer: No Typology Code available for payment source | Attending: Emergency Medicine | Admitting: Emergency Medicine

## 2019-12-31 ENCOUNTER — Emergency Department (HOSPITAL_COMMUNITY): Payer: No Typology Code available for payment source

## 2019-12-31 ENCOUNTER — Encounter (HOSPITAL_COMMUNITY): Payer: Self-pay | Admitting: Emergency Medicine

## 2019-12-31 DIAGNOSIS — R079 Chest pain, unspecified: Secondary | ICD-10-CM | POA: Insufficient documentation

## 2019-12-31 LAB — CBC
HCT: 44.8 % (ref 39.0–52.0)
Hemoglobin: 15.4 g/dL (ref 13.0–17.0)
MCH: 28.7 pg (ref 26.0–34.0)
MCHC: 34.4 g/dL (ref 30.0–36.0)
MCV: 83.6 fL (ref 80.0–100.0)
Platelets: 249 10*3/uL (ref 150–400)
RBC: 5.36 MIL/uL (ref 4.22–5.81)
RDW: 11.5 % (ref 11.5–15.5)
WBC: 8.9 10*3/uL (ref 4.0–10.5)
nRBC: 0 % (ref 0.0–0.2)

## 2019-12-31 LAB — BASIC METABOLIC PANEL
Anion gap: 10 (ref 5–15)
BUN: 20 mg/dL (ref 6–20)
CO2: 28 mmol/L (ref 22–32)
Calcium: 9.5 mg/dL (ref 8.9–10.3)
Chloride: 104 mmol/L (ref 98–111)
Creatinine, Ser: 1.25 mg/dL — ABNORMAL HIGH (ref 0.61–1.24)
GFR calc Af Amer: >60 mL/min (ref >60)
GFR calc non Af Amer: >60 mL/min (ref >60)
Glucose, Bld: 88 mg/dL (ref 70–99)
Potassium: 4.6 mmol/L (ref 3.5–5.1)
Sodium: 142 mmol/L (ref 135–145)

## 2019-12-31 LAB — TROPONIN I (HIGH SENSITIVITY): Troponin I (High Sensitivity): 4 ng/L (ref <18)

## 2019-12-31 MED ORDER — SODIUM CHLORIDE 0.9% FLUSH: 3.0000 mL | Freq: Once | INTRAVENOUS | Status: DC

## 2019-12-31 NOTE — ED Triage Notes (Signed)
Pt c/o mild chest pain, shortness of breath and palpitations x 4 days.

## 2019-12-31 NOTE — ED Provider Notes (Signed)
[facility] EMERGENCY DEPARTMENT Provider Note   CSN: [id number] Arrival date & time: 12/31/19  2144     History Chief Complaint  Patient presents with  . Chest Pain    Corey Cole is a 29 y.o. male.  29 yo M with a chief complaints of feeling like his heart is beating hard and fast.  Usually last for less than 10 seconds at a time.  Seems to come and go.  Nothing seems to make it better or worse.  He has no exertional symptoms.  No shortness of breath with this.  Denies nausea vomiting or diarrhea denies cough congestion or fever.  Patient denies history of MI, denies hypertension hyperlipidemia diabetes or smoking.  He thinks maybe his mother and father had an MI but he is not sure. Patient denies history of PE or DVT denies hemoptysis denies unilateral lower extremity edema denies recent surgery immobilization hospitalization estrogen use or history of cancer.   The history is provided by the patient.  Chest Pain Pain location:  Substernal area Pain quality: aching   Pain radiates to:  Does not radiate Pain severity:  Moderate Onset quality:  Gradual Duration:  4 days Timing:  Constant Progression:  Worsening Chronicity:  New Relieved by:  Nothing Worsened by:  Nothing Ineffective treatments:  None tried Associated symptoms: palpitations   Associated symptoms: no abdominal pain, no fever, no headache, no shortness of breath and no vomiting        History reviewed. No pertinent past medical history.  There are no problems to display for this patient.   History reviewed. No pertinent surgical history.     No family history on file.  Social History   Tobacco Use  . Smoking status: Never Smoker  Substance Use Topics  . Alcohol use: No  . Drug use: No    Home Medications Prior to Admission medications   Medication Sig Start Date End Date Taking? Authorizing Provider  cyclobenzaprine (FLEXERIL) 10 MG tablet Take 1 tablet (10 mg total)  by mouth 2 (two) times daily as needed for muscle spasms. 12/01/13   Szekalski, Kaitlyn, PA-C  traMADol (ULTRAM) 50 MG tablet Take 1 tablet (50 mg total) by mouth every 6 (six) hours as needed. 12/01/13   Alvina Chou, PA-C    Allergies    Patient has no known allergies.  Review of Systems   Review of Systems  Constitutional: Negative for chills and fever.  HENT: Negative for congestion and facial swelling.   Eyes: Negative for discharge and visual disturbance.  Respiratory: Negative for shortness of breath.   Cardiovascular: Positive for chest pain and palpitations.  Gastrointestinal: Negative for abdominal pain, diarrhea and vomiting.  Musculoskeletal: Negative for arthralgias and myalgias.  Skin: Negative for color change and rash.  Neurological: Negative for tremors, syncope and headaches.  Psychiatric/Behavioral: Negative for confusion and dysphoric mood.    Physical Exam Updated Vital Signs BP 128/75 (BP Location: Right Arm)   Pulse 60   Temp 98.9 F (37.2 C) (Oral)   Resp 14   SpO2 95%   Physical Exam Vitals and nursing note reviewed.  Constitutional:      Appearance: He is well-developed.  HENT:     Head: Normocephalic and atraumatic.  Eyes:     Pupils: Pupils are equal, round, and reactive to light.  Neck:     Vascular: No JVD.  Cardiovascular:     Rate and Rhythm: Normal rate and regular rhythm.  Heart sounds: No murmur. No friction rub. No gallop.   Pulmonary:     Effort: No respiratory distress.     Breath sounds: No wheezing.  Abdominal:     General: There is no distension.     Tenderness: There is no guarding or rebound.  Musculoskeletal:        General: Normal range of motion.     Cervical back: Normal range of motion and neck supple.  Skin:    Coloration: Skin is not pale.     Findings: No rash.  Neurological:     Mental Status: He is alert and oriented to person, place, and time.  Psychiatric:        Behavior: Behavior normal.      ED Results / Procedures / Treatments   Labs (all labs ordered are listed, but only abnormal results are displayed) Labs Reviewed  BASIC METABOLIC PANEL - Abnormal; Notable for the following components:      Result Value   Creatinine, Ser 1.25 (*)    All other components within normal limits  CBC  TROPONIN I (HIGH SENSITIVITY)    EKG EKG Interpretation  Date/Time:  Tuesday Dec 31 2019 21:50:49 EDT Ventricular Rate:  73 PR Interval:  148 QRS Duration: 88 QT Interval:  382 QTC Calculation: 420 R Axis:   86 Text Interpretation: Normal sinus rhythm Cannot rule out Anterior infarct , age undetermined Abnormal ECG No old tracing to compare Confirmed by Melene Plan [phone]) on 12/31/2019 11:00:58 PM   Radiology DG Chest 2 View  Result Date: 12/31/2019 CLINICAL DATA:  Chest pain and palpitations and shortness of breath for 4 days. EXAM: CHEST - 2 VIEW COMPARISON:  PA and lateral chest 07/13/2012. FINDINGS: Lungs clear. Heart size normal. No pneumothorax or pleural fluid. No acute or focal bony abnormality. IMPRESSION: Normal chest. Electronically Signed   By: Drusilla Kanner M.D.   On: 12/31/2019 22:32    Procedures Procedures (including critical care time)   "The cardiac monitor revealed normal sinus rhythm as interpreted by me. The cardiac monitor was ordered secondary to the patient's history of palpitations and to monitor the patient for dysrhythmia."   Medications Ordered in ED Medications - No data to display  ED Course  I have reviewed the triage vital signs and the nursing notes.  Pertinent labs & imaging results that were available during my care of the patient were reviewed by me and considered in my medical decision making (see chart for details).    MDM Rules/Calculators/A&P                      29 yo F with a chief complaint of palpitations.  Seem to come and go.  Benign exam.  Completely atypical of ACS.  Had a single troponin ordered in triage is negative.   I feel nothing for work-up for ACS.  He is PERC negative.  Discharge home.  PCP follow-up.  11:36 PM:  I have discussed the diagnosis/risks/treatment options with the patient and believe the pt to be eligible for discharge home to follow-up with PCP. We also discussed returning to the ED immediately if new or worsening sx occur. We discussed the sx which are most concerning (e.g., sudden worsening pain, fever, inability to tolerate by mouth) that necessitate immediate return. Medications administered to the patient during their visit and any new prescriptions provided to the patient are listed below.  Medications given during this visit Medications - No data to display  The patient appears reasonably screen and/or stabilized for discharge and I doubt any other medical condition or other [facility] requiring further screening, evaluation, or treatment in the ED at this time prior to discharge.   Final Clinical Impression(s) / ED Diagnoses Final diagnoses:  Nonspecific chest pain    Rx / DC Orders ED Discharge Orders    None       Melene Plan, DO 12/31/19 2336

## 2019-12-31 NOTE — Discharge Instructions (Signed)
I did not see anything concerning on your cardiac monitor.  Please follow-up with your family doctor in the office they will put you on a Holter monitor.  Try zantac or pepcid twice a day.  Try to avoid things that may make this worse, most commonly these are spicy foods tomato based products fatty foods chocolate and peppermint.  Alcohol and tobacco can also make this worse.  Return to the emergency department for sudden worsening pain fever or inability to eat or drink.

## 2020-01-23 MED FILL — CHLORHEXIDINE 0.12% RINSE: 0.12 | 30 days supply | Qty: 473 | Fill #0

## 2020-01-23 MED FILL — AMOXICILLIN 500 MG CAPSULE: 500 | 7 days supply | Qty: 28 | Fill #0

## 2020-11-18 ENCOUNTER — Other Ambulatory Visit: Payer: Self-pay | Admitting: Physician Assistant

## 2021-09-16 IMAGING — DX DG CHEST 2V
2 series · 2 of 2 positions shown · non-contrast
Comparison: PA and lateral chest 07/13/2012.

CLINICAL DATA: Chest pain and palpitations and shortness of breath
for 4 days.

EXAM:
CHEST - 2 VIEW

[w chest pa]
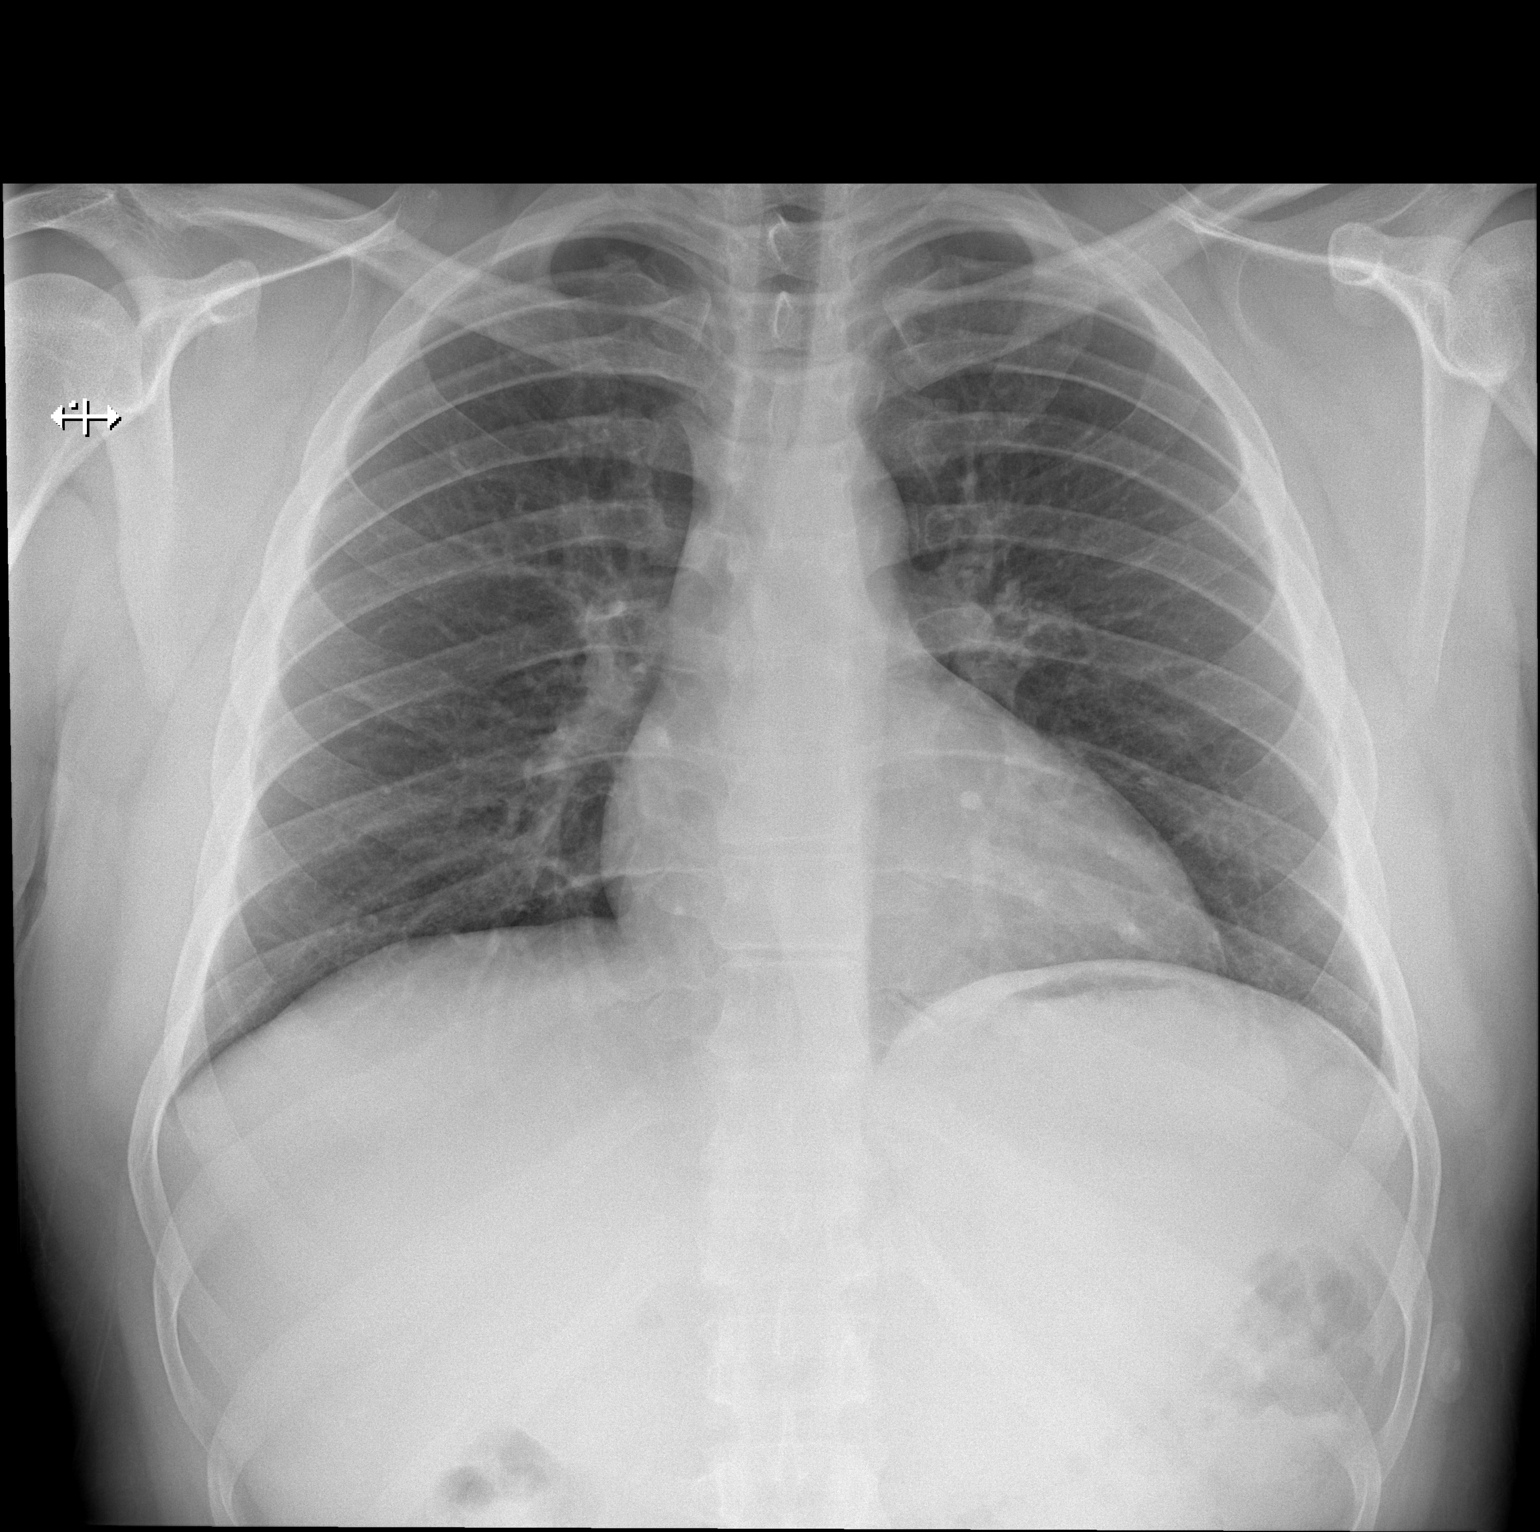

[w chest lat]
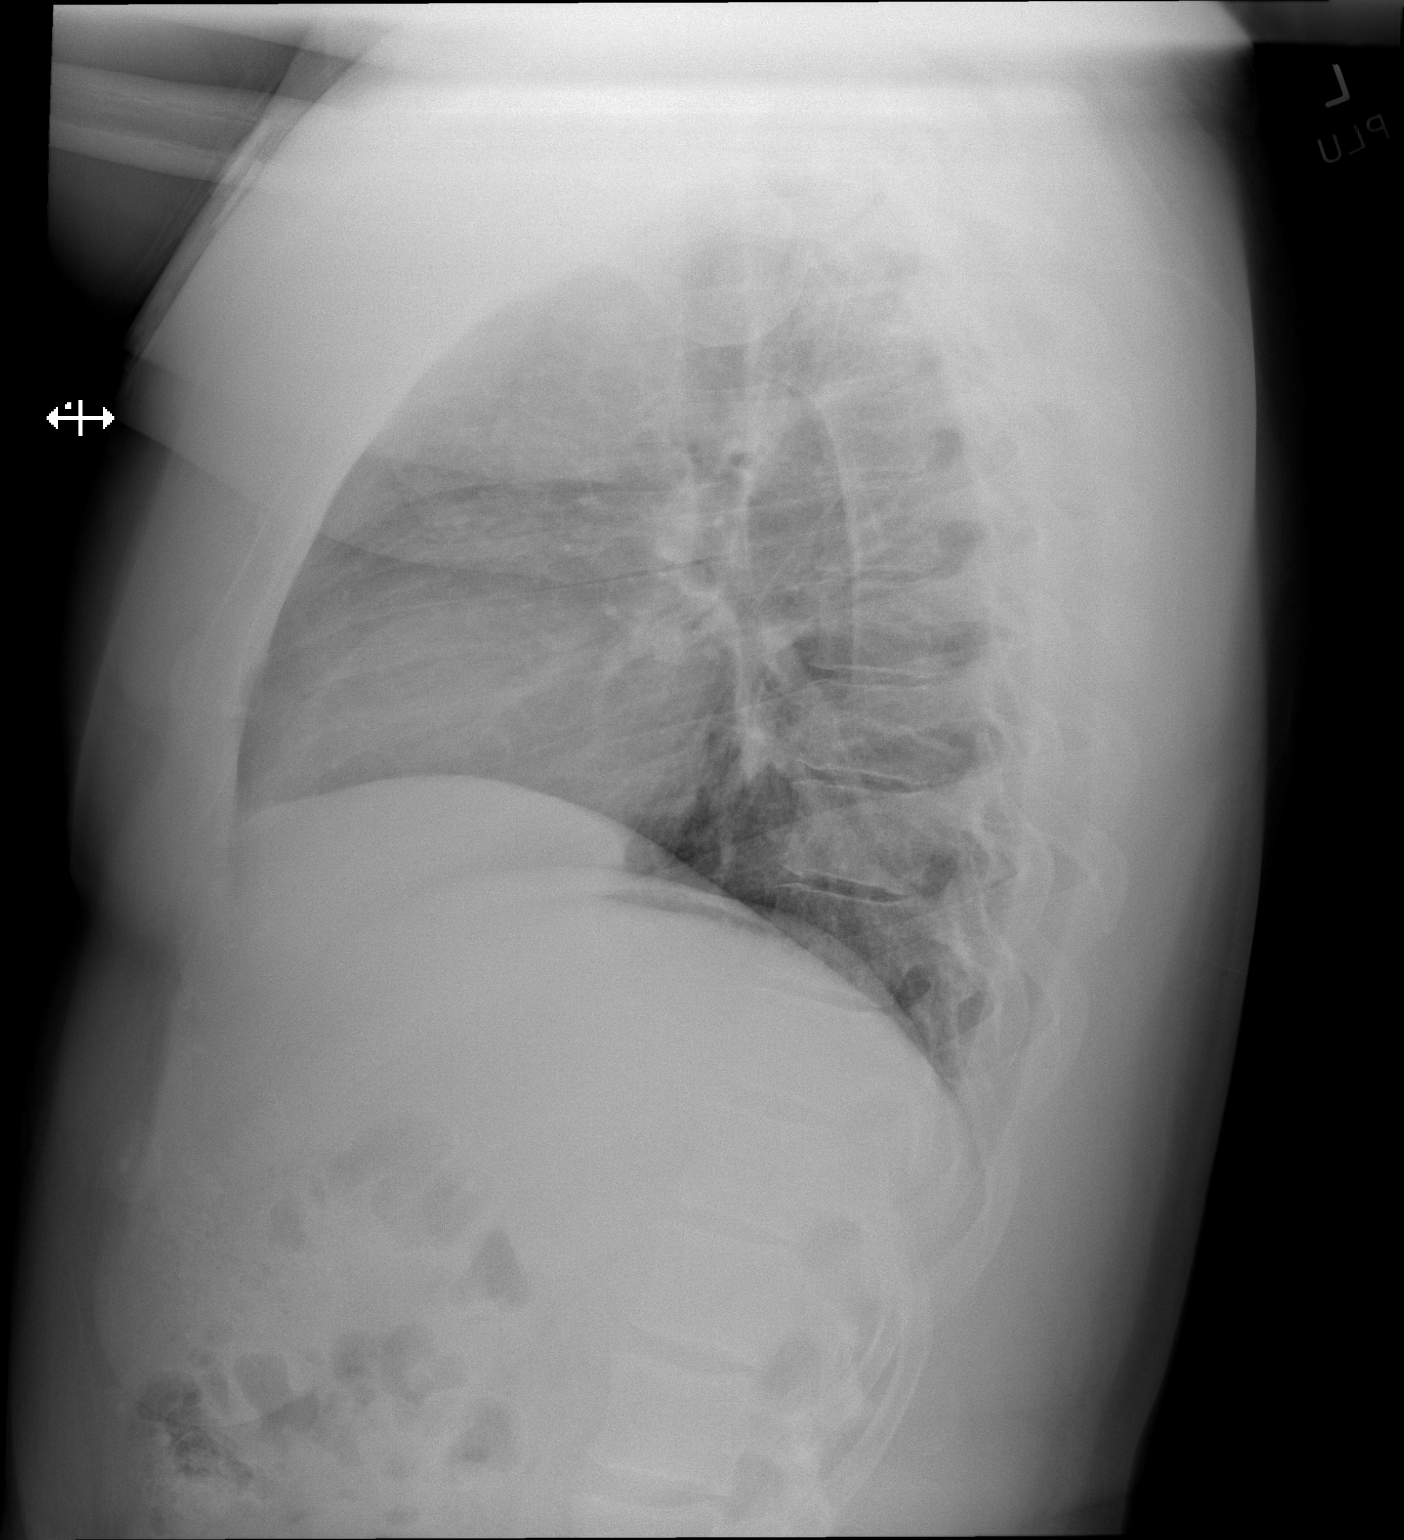

[2 of 2 positions shown; findings below may reference images not displayed]

FINDINGS: Lungs clear. Heart size normal. No pneumothorax or pleural fluid. No
acute or focal bony abnormality.
IMPRESSION: Normal chest.

## 2022-05-06 ENCOUNTER — Ambulatory Visit: Payer: Self-pay | Admitting: Family Medicine

## 2022-05-06 ENCOUNTER — Telehealth: Payer: Self-pay | Admitting: General Practice

## 2022-05-06 NOTE — Telephone Encounter (Signed)
Pt called to reschedule at 11:00 he said he did 2 days ago.

## 2022-05-24 ENCOUNTER — Ambulatory Visit (INDEPENDENT_AMBULATORY_CARE_PROVIDER_SITE_OTHER): Payer: Commercial Managed Care - HMO | Admitting: Family Medicine

## 2022-05-24 ENCOUNTER — Encounter: Payer: Self-pay | Admitting: Family Medicine

## 2022-05-24 VITALS — BP 122/80 | HR 67 | Temp 97.6°F | Ht 67.75 in | Wt 227.6 lb

## 2022-05-24 DIAGNOSIS — E669 Obesity, unspecified: Secondary | ICD-10-CM | POA: Diagnosis not present

## 2022-05-24 DIAGNOSIS — Z136 Encounter for screening for cardiovascular disorders: Secondary | ICD-10-CM

## 2022-05-24 DIAGNOSIS — Z130 Encounter for screening for diseases of the blood and blood-forming organs and certain disorders involving the immune mechanism: Secondary | ICD-10-CM

## 2022-05-24 DIAGNOSIS — H5213 Myopia, bilateral: Secondary | ICD-10-CM

## 2022-05-24 DIAGNOSIS — Z1321 Encounter for screening for nutritional disorder: Secondary | ICD-10-CM

## 2022-05-24 DIAGNOSIS — Z6834 Body mass index (BMI) 34.0-34.9, adult: Secondary | ICD-10-CM

## 2022-05-24 DIAGNOSIS — Z13220 Encounter for screening for lipoid disorders: Secondary | ICD-10-CM

## 2022-05-24 DIAGNOSIS — Z0000 Encounter for general adult medical examination without abnormal findings: Secondary | ICD-10-CM | POA: Diagnosis not present

## 2022-05-24 DIAGNOSIS — Z1329 Encounter for screening for other suspected endocrine disorder: Secondary | ICD-10-CM | POA: Diagnosis not present

## 2022-05-24 DIAGNOSIS — Z13228 Encounter for screening for other metabolic disorders: Secondary | ICD-10-CM

## 2022-05-24 NOTE — Progress Notes (Signed)
Chief Complaint:  Corey Cole is a 31 y.o. male who presents today for his annual comprehensive physical exam.    Assessment/Plan:   Problem List Items Addressed This Visit       Other   Class 1 obesity without serious comorbidity with body mass index (BMI) of 34.0 to 34.9 in adult    Counseled on risks associated with obesity Counseled on diet and exercise      Relevant Orders   TSH (Completed)   Lipid panel (Completed)   Hemoglobin A1c (Completed)   Microalbumin / creatinine urine ratio (Completed)   Urinalysis, Routine w reflex microscopic   Vitamin D 1,25 dihydroxy   CBC with Differential/Platelet (Completed)   Comprehensive metabolic panel (Completed)   Other Visit Diagnoses     Encounter for well adult exam without abnormal findings    -  Primary   Relevant Orders   TSH (Completed)   Lipid panel (Completed)   Hemoglobin A1c (Completed)   Microalbumin / creatinine urine ratio (Completed)   Urinalysis, Routine w reflex microscopic   Vitamin D 1,25 dihydroxy   CBC with Differential/Platelet (Completed)   Comprehensive metabolic panel (Completed)   Screening for endocrine, nutritional, metabolic and immunity disorder       Relevant Orders   TSH (Completed)   Hemoglobin A1c (Completed)   Microalbumin / creatinine urine ratio (Completed)   Urinalysis, Routine w reflex microscopic   Vitamin D 1,25 dihydroxy   CBC with Differential/Platelet (Completed)   Comprehensive metabolic panel (Completed)   Encounter for lipid screening for cardiovascular disease       Relevant Orders   Lipid panel (Completed)   Myopia of both eyes            Patient Counseling(The following topics were reviewed and/or handout was given):  -Nutrition: Stressed importance of moderation in sodium/caffeine intake, saturated fat and cholesterol, caloric balance, sufficient intake of fresh fruits, vegetables, and fiber.  -Stressed the importance of regular exercise.   -Substance Abuse:  Discussed cessation/primary prevention of tobacco, alcohol, or other drug use; driving or other dangerous activities under the influence; availability of treatment for abuse.   -Injury prevention: Discussed safety belts, safety helmets, smoke detector, smoking near bedding or upholstery.   -Sexuality: Discussed sexually transmitted diseases, partner selection, use of condoms, avoidance of unintended pregnancy and contraceptive alternatives.   -Dental health: Discussed importance of regular tooth brushing, flossing, and dental visits.  -Health maintenance and immunizations reviewed. Please refer to Health maintenance section.  Return to care in 1 year for next preventative visit.     Subjective:  HPI:  He has no acute complaints today.  .  Patient is here to establish care.  Patient reports he is originally from [place].  But that his family moved to [place].  He exercises by walking 30 minutes every day and weight lifting 4x a week.   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  He last saw eye doctor about 7 months ago.  In hasn't saw eye doctor several years ago, 7 mo       05/24/2022    2:49 PM  Depression screen PHQ 2/9  Decreased Interest 1  Down, Depressed, Hopeless 0  PHQ - 2 Score 1  Altered sleeping 0  Tired, decreased energy 0  Change in appetite 0  Feeling bad or failure about yourself  0  Trouble concentrating 0  Moving slowly or fidgety/restless 0  Suicidal thoughts 0  PHQ-9 Score 1  Difficult doing work/chores  Not difficult at all    There are no preventive care reminders to display for this patient.     ROS: Denies chest pain shortness of breath, lower extremity swelling, headache, otherwise all systems reviewed and are negative  PMH:  The following were reviewed and entered/updated in epic: History reviewed. No pertinent past medical history. Patient Active Problem List   Diagnosis Date Noted   Class 1 obesity without serious comorbidity with body  mass index (BMI) of 34.0 to 34.9 in adult 05/27/2022   History reviewed. No pertinent surgical history.  Family History  Problem Relation Age of Onset   High blood pressure Mother    High Cholesterol Mother    Diabetes Mother    High blood pressure Father    High Cholesterol Father     Medications- reviewed and updated No current outpatient medications on file.   No current facility-administered medications for this visit.    Allergies-reviewed and updated No Known Allergies  Social History   Socioeconomic History   Marital status: Single    Spouse name: Not on file   Number of children: Not on file   Years of education: Not on file   Highest education level: Not on file  Occupational History   Not on file  Tobacco Use   Smoking status: Never    Passive exposure: Never   Smokeless tobacco: Not on file  Vaping Use   Vaping Use: Never used  Substance and Sexual Activity   Alcohol use: No   Drug use: No   Sexual activity: Not on file  Other Topics Concern   Not on file  Social History Narrative   Not on file   Social Determinants of Health   Financial Resource Strain: Not on file  Food Insecurity: Not on file  Transportation Needs: Not on file  Physical Activity: Not on file  Stress: Not on file  Social Connections: Not on file        Objective:  Physical Exam: BP 122/80 (BP Location: Left Arm, Patient Position: Sitting, Cuff Size: Large)   Pulse 67   Temp 97.6 F (36.4 C) (Temporal)   Ht 5' 7.75" (1.721 m)   Wt 227 lb 9.6 oz (103.2 kg)   SpO2 97%   BMI 34.86 kg/m   Body mass index is 34.86 kg/m. Wt Readings from Last 3 Encounters:  05/24/22 227 lb 9.6 oz (103.2 kg)  08/02/13 199 lb 1 oz (90.3 kg)  07/13/12 210 lb (95.3 kg) (96 %, Z= 1.70)*   * Growth percentiles are based on CDC (Boys, 2-20 Years) data.    Gen: NAD, resting comfortably CV: RRR with no murmurs appreciated Pulm: NWOB, CTAB with no crackles, wheezes, or rhonchi GI: Normal  bowel sounds present. Soft, Nontender, Nondistended. MSK: no edema, cyanosis, or clubbing noted Skin: warm, dry Neuro: grossly normal, moves all extremities Psych: Normal affect and thought content      At today's visit, we discussed treatment options, associated risk and benefits, and engage in counseling as needed.  Additionally the following were reviewed: Past medical records, past medical and surgical history, family and social background, as well as relevant laboratory results, imaging findings, and specialty notes, where applicable.  This message was generated using dictation software, and as a result, it may contain unintentional typos or errors.  Nevertheless, extensive effort was made to accurately convey at the pertinent aspects of the patient visit.    There may have been are other unrelated non-urgent complaints, but  due to the busy schedule and the amount of time already spent with him, time does not permit to address these issues at today's visit. Another appointment may have or has been requested to review these additional issues.   Thomes Dinning, MD, MS

## 2022-05-25 LAB — HEMOGLOBIN A1C: Hgb A1c MFr Bld: 5.2 % (ref 4.6–6.5)

## 2022-05-25 LAB — CBC WITH DIFFERENTIAL/PLATELET
Basophils Absolute: 0 10*3/uL (ref 0.0–0.1)
Basophils Relative: 0.7 % (ref 0.0–3.0)
Eosinophils Absolute: 0.1 10*3/uL (ref 0.0–0.7)
Eosinophils Relative: 1.4 % (ref 0.0–5.0)
HCT: 43.8 % (ref 39.0–52.0)
Hemoglobin: 15.1 g/dL (ref 13.0–17.0)
Lymphocytes Relative: 38.4 % (ref 12.0–46.0)
Lymphs Abs: 2.3 10*3/uL (ref 0.7–4.0)
MCHC: 34.4 g/dL (ref 30.0–36.0)
MCV: 84.5 fl (ref 78.0–100.0)
Monocytes Absolute: 0.6 10*3/uL (ref 0.1–1.0)
Monocytes Relative: 9.6 % (ref 3.0–12.0)
Neutro Abs: 2.9 10*3/uL (ref 1.4–7.7)
Neutrophils Relative %: 49.9 % (ref 43.0–77.0)
Platelets: 219 10*3/uL (ref 150.0–400.0)
RBC: 5.18 Mil/uL (ref 4.22–5.81)
RDW: 12.8 % (ref 11.5–15.5)
WBC: 5.9 10*3/uL (ref 4.0–10.5)

## 2022-05-25 LAB — COMPREHENSIVE METABOLIC PANEL
ALT: 21 U/L (ref 0–53)
AST: 17 U/L (ref 0–37)
Albumin: 4.8 g/dL (ref 3.5–5.2)
Alkaline Phosphatase: 47 U/L (ref 39–117)
BUN: 21 mg/dL (ref 6–23)
CO2: 28 mEq/L (ref 19–32)
Calcium: 9.4 mg/dL (ref 8.4–10.5)
Chloride: 104 mEq/L (ref 96–112)
Creatinine, Ser: 1.04 mg/dL (ref 0.40–1.50)
GFR: 97.4 mL/min (ref >60.00)
Glucose, Bld: 95 mg/dL (ref 70–99)
Potassium: 4.5 mEq/L (ref 3.5–5.1)
Sodium: 141 mEq/L (ref 135–145)
Total Bilirubin: 0.3 mg/dL (ref 0.2–1.2)
Total Protein: 7.1 g/dL (ref 6.0–8.3)

## 2022-05-25 LAB — LIPID PANEL
Cholesterol: 194 mg/dL (ref 0–200)
HDL: 37.1 mg/dL — ABNORMAL LOW (ref >39.00)
LDL Cholesterol: 132 mg/dL — ABNORMAL HIGH (ref 0–99)
NonHDL: 156.53
Total CHOL/HDL Ratio: 5
Triglycerides: 122 mg/dL (ref 0.0–149.0)
VLDL: 24.4 mg/dL (ref 0.0–40.0)

## 2022-05-25 LAB — MICROALBUMIN / CREATININE URINE RATIO
Creatinine,U: 152.1 mg/dL
Microalb Creat Ratio: 0.8 mg/g (ref 0.0–30.0)
Microalb, Ur: 1.2 mg/dL (ref 0.0–1.9)

## 2022-05-25 LAB — TSH: TSH: 1.47 u[IU]/mL (ref 0.35–5.50)

## 2022-05-27 DIAGNOSIS — E669 Obesity, unspecified: Secondary | ICD-10-CM | POA: Insufficient documentation

## 2022-05-27 DIAGNOSIS — E66811 Obesity, class 1: Secondary | ICD-10-CM | POA: Insufficient documentation

## 2022-05-27 NOTE — Assessment & Plan Note (Addendum)
Counseled on risks associated with obesity Counseled on diet and exercise

## 2022-05-28 LAB — VITAMIN D 1,25 DIHYDROXY
Vitamin D 1, 25 (OH)2 Total: 48 pg/mL (ref 18–72)
Vitamin D2 1, 25 (OH)2: <8 pg/mL
Vitamin D3 1, 25 (OH)2: 48 pg/mL

## 2023-08-01 ENCOUNTER — Ambulatory Visit (INDEPENDENT_AMBULATORY_CARE_PROVIDER_SITE_OTHER): Payer: Medicaid Other | Admitting: Family Medicine

## 2023-08-01 ENCOUNTER — Encounter: Payer: Self-pay | Admitting: Family Medicine

## 2023-08-01 VITALS — BP 128/76 | HR 92 | Temp 97.7°F | Ht 67.75 in | Wt 235.2 lb

## 2023-08-01 DIAGNOSIS — Z6836 Body mass index (BMI) 36.0-36.9, adult: Secondary | ICD-10-CM

## 2023-08-01 DIAGNOSIS — R519 Headache, unspecified: Secondary | ICD-10-CM | POA: Insufficient documentation

## 2023-08-01 DIAGNOSIS — H5213 Myopia, bilateral: Secondary | ICD-10-CM | POA: Diagnosis not present

## 2023-08-01 DIAGNOSIS — E6609 Other obesity due to excess calories: Secondary | ICD-10-CM

## 2023-08-01 DIAGNOSIS — G8929 Other chronic pain: Secondary | ICD-10-CM

## 2023-08-01 DIAGNOSIS — K219 Gastro-esophageal reflux disease without esophagitis: Secondary | ICD-10-CM | POA: Insufficient documentation

## 2023-08-01 DIAGNOSIS — E782 Mixed hyperlipidemia: Secondary | ICD-10-CM | POA: Diagnosis not present

## 2023-08-01 DIAGNOSIS — E66812 Obesity, class 2: Secondary | ICD-10-CM | POA: Diagnosis not present

## 2023-08-01 NOTE — Progress Notes (Signed)
Assessment  Assessment/Plan:   Problem List Items Addressed This Visit       Digestive   Gastroesophageal reflux disease    Improving. Reports intermittent epigastric discomfort relieved by Tums and cucumbers.  Plan:  Recommend lifestyle modifications: reduce caffeine intake, avoid trigger foods, avoid late meals. Consider weight loss to reduce symptoms. Patient prefers to monitor symptoms before initiating medication. Reevaluate if symptoms persist or worsen.        Other   Class 1 obesity without serious comorbidity with body mass index (BMI) of 34.0 to 34.9 in adult - Primary    Worsening.  Plan:  Encourage continuation of regular exercise regimen. Discuss dietary modifications to support weight loss. Emphasize importance of weight reduction for health benefits.      Moderate mixed hyperlipidemia not requiring statin therapy    Schedule fasting lipid panel to reassess cholesterol levels. Advise dietary modifications to improve lipid profile.      Myopia of both eyes   Chronic nonintractable headache    Chronic Condition Status: Stable.  Differential Diagnosis:   Migraine without aura. Tension-type headaches. Refractive errors due to overdue eye exam.  Plan: Advise scheduling an eye exam due to time since last visit. Recommend monitoring headache frequency and characteristics. Continue using ibuprofen as needed. Discuss sleep hygiene and consider reducing caffeine intake. Follow up if headaches become more frequent, severe, or new symptoms develop.       There are no discontinued medications.  Patient Counseling(The following topics were reviewed and/or handout was given):  -Nutrition: Stressed importance of moderation in sodium/caffeine intake, saturated fat and cholesterol, caloric balance, sufficient intake of fresh fruits, vegetables, and fiber.  -Stressed the importance of regular exercise.   -Substance Abuse: Discussed cessation/primary  prevention of tobacco, alcohol, or other drug use; driving or other dangerous activities under the influence; availability of treatment for abuse.   -Injury prevention: Discussed safety belts, safety helmets, smoke detector, smoking near bedding or upholstery.   -Sexuality: Discussed sexually transmitted diseases, partner selection, use of condoms, avoidance of unintended pregnancy and contraceptive alternatives.   -Dental health: Discussed importance of regular tooth brushing, flossing, and dental visits.  -Health maintenance and immunizations reviewed. Please refer to Health maintenance section.  Return to care in 1 year for next preventative visit.        Subjective:  Chief complaint Encounter date: 08/01/2023  Chief Complaint  Patient presents with   Annual Exam    Frequent headaches. Non fasting.   Chief Complaint: Annual physical examination; reports frequent headaches.  History of Present Illness:  Presenting for an annual physical examination. Reports intermittent headaches occurring once or twice a month, lasting 2-3 hours. Headaches are relieved by ibuprofen. No associated nausea, vomiting, photophobia, phonophobia, or aura. Denies identifiable triggers; does not feel headaches coming on. Last eye exam was a "long time ago." Consumes 2-3 cups of coffee daily; has not attempted to reduce intake. Reports occasional abdominal discomfort in the epigastric region, sometimes on left and right sides. Symptoms are relieved by Tums and cucumbers. Similar symptoms in the past diagnosed as heartburn; currently improved.  Review of Systems:  Constitutional: No fevers; weight gain of 8 pounds since last visit. Eyes: Denies vision changes. ENT: Denies photophobia, phonophobia. Cardiovascular: Denies chest pain, palpitations. Respiratory: Denies shortness of breath. Gastrointestinal: Reports intermittent epigastric discomfort; denies nausea, vomiting. Neurological: Reports  headaches as described; denies weakness, numbness, or tingling. Psychiatric: Denies daytime fatigue; reports adequate sleep. Remainder of ROS negative.  Lifestyle:  Diet:  Breakfast includes pita bread with omelet. Consumes 2 cups of coffee in the morning before the gym and 1 after dinner. Dinner typically consists of rice with chicken or meat (could be lamb). Snacks on fruits and nuts between breakfast and dinner. Does not eat lunch. Drinks "poppies" probiotic sodas, 3-5 cans per week.  Exercise:  Exercises 3-5 times per week for 1 hour and 15-20 minutes per session. Routine includes 30 minutes of cardio (recently started running) and 30-40 minutes of weightlifting.  Health Maintenance:  Dental: Not up to date;   Vision: Not up to date;        08/01/2023    8:58 AM 05/24/2022    2:49 PM  Depression screen PHQ 2/9  Decreased Interest 0 1  Down, Depressed, Hopeless 0 0  PHQ - 2 Score 0 1  Altered sleeping 0 0  Tired, decreased energy 0 0  Change in appetite 0 0  Feeling bad or failure about yourself  0 0  Trouble concentrating 0 0  Moving slowly or fidgety/restless 0 0  Suicidal thoughts 0 0  PHQ-9 Score 0 1  Difficult doing work/chores Not difficult at all Not difficult at all       08/01/2023    8:58 AM 05/24/2022    2:49 PM  GAD 7 : Generalized Anxiety Score  Nervous, Anxious, on Edge 0 0  Control/stop worrying 0 0  Worry too much - different things 0 0  Trouble relaxing 0 0  Restless 0 0  Easily annoyed or irritable 0 0  Afraid - awful might happen 0 0  Total GAD 7 Score 0 0  Anxiety Difficulty Not difficult at all Not difficult at all    There are no preventive care reminders to display for this patient.    PMH:  The following were reviewed and entered/updated in epic: History reviewed. No pertinent past medical history.  Patient Active Problem List   Diagnosis Date Noted   Moderate mixed hyperlipidemia not requiring statin therapy 08/01/2023    Myopia of both eyes 08/01/2023   Chronic nonintractable headache 08/01/2023   Gastroesophageal reflux disease 08/01/2023   Class 1 obesity without serious comorbidity with body mass index (BMI) of 34.0 to 34.9 in adult 05/27/2022    History reviewed. No pertinent surgical history.  Family History  Problem Relation Age of Onset   High blood pressure Mother    High Cholesterol Mother    Diabetes Mother    High blood pressure Father    High Cholesterol Father     Medications- reviewed and updated No outpatient medications prior to visit.   No facility-administered medications prior to visit.    No Known Allergies  Social History   Socioeconomic History   Marital status: Single    Spouse name: Not on file   Number of children: Not on file   Years of education: Not on file   Highest education level: Not on file  Occupational History   Not on file  Tobacco Use   Smoking status: Never    Passive exposure: Never   Smokeless tobacco: Not on file  Vaping Use   Vaping status: Never Used  Substance and Sexual Activity   Alcohol use: No   Drug use: No   Sexual activity: Not on file  Other Topics Concern   Not on file  Social History Narrative   Not on file   Social Determinants of Health   Financial Resource Strain: Not on file  Food Insecurity:  Not on file  Transportation Needs: Not on file  Physical Activity: Not on file  Stress: Not on file  Social Connections: Not on file           Objective:  Physical Exam: BP 128/76 (BP Location: Left Arm, Patient Position: Sitting, Cuff Size: Large)   Pulse 92   Temp 97.7 F (36.5 C) (Temporal)   Ht 5' 7.75" (1.721 m)   Wt 235 lb 3.2 oz (106.7 kg)   SpO2 97%   BMI 36.03 kg/m   Body mass index is 36.03 kg/m. Wt Readings from Last 3 Encounters:  08/01/23 235 lb 3.2 oz (106.7 kg)  05/24/22 227 lb 9.6 oz (103.2 kg)  08/02/13 199 lb 1 oz (90.3 kg)    Physical Exam Constitutional:      General: He is not in  acute distress.    Appearance: Normal appearance. He is not ill-appearing or toxic-appearing.  HENT:     Head: Normocephalic and atraumatic.     Right Ear: Hearing, tympanic membrane, ear canal and external ear normal. There is no impacted cerumen.     Left Ear: Hearing, tympanic membrane, ear canal and external ear normal. There is no impacted cerumen.     Nose: Nose normal. No congestion.     Mouth/Throat:     Lips: No lesions.     Mouth: Mucous membranes are moist.     Pharynx: Oropharynx is clear. No oropharyngeal exudate.  Eyes:     General: No scleral icterus.       Right eye: No discharge.        Left eye: No discharge.     Conjunctiva/sclera: Conjunctivae normal.     Pupils: Pupils are equal, round, and reactive to light.     Comments: Wears glassess  Neck:     Thyroid: No thyroid mass, thyromegaly or thyroid tenderness.  Cardiovascular:     Rate and Rhythm: Normal rate and regular rhythm.     Pulses: Normal pulses.     Heart sounds: Normal heart sounds.  Pulmonary:     Effort: Pulmonary effort is normal. No respiratory distress.     Breath sounds: Normal breath sounds.  Abdominal:     General: Abdomen is flat. Bowel sounds are normal.     Palpations: Abdomen is soft.  Musculoskeletal:        General: Normal range of motion.     Cervical back: Normal range of motion.     Right lower leg: No edema.     Left lower leg: No edema.  Lymphadenopathy:     Cervical: No cervical adenopathy.  Skin:    General: Skin is warm and dry.     Findings: No rash.  Neurological:     General: No focal deficit present.     Mental Status: He is alert and oriented to person, place, and time. Mental status is at baseline.     Deep Tendon Reflexes:     Reflex Scores:      Patellar reflexes are 2+ on the right side and 2+ on the left side. Psychiatric:        Mood and Affect: Mood normal.        Behavior: Behavior normal.        Thought Content: Thought content normal.        Judgment:  Judgment normal.        Prior labs:   No results found for this or any previous visit (from the past 2160  hour(s)).  Lab Results  Component Value Date   CHOL 194 05/24/2022   Lab Results  Component Value Date   HDL 37.10 (L) 05/24/2022   Lab Results  Component Value Date   LDLCALC 132 (H) 05/24/2022   Lab Results  Component Value Date   TRIG 122.0 05/24/2022   Lab Results  Component Value Date   CHOLHDL 5 05/24/2022   No results found for: "LDLDIRECT"  Last metabolic panel Lab Results  Component Value Date   GLUCOSE 95 05/24/2022   NA 141 05/24/2022   K 4.5 05/24/2022   CL 104 05/24/2022   CO2 28 05/24/2022   BUN 21 05/24/2022   CREATININE 1.04 05/24/2022   GFR 97.40 05/24/2022   CALCIUM 9.4 05/24/2022   PROT 7.1 05/24/2022   ALBUMIN 4.8 05/24/2022   BILITOT 0.3 05/24/2022   ALKPHOS 47 05/24/2022   AST 17 05/24/2022   ALT 21 05/24/2022   ANIONGAP 10 12/31/2019    Lab Results  Component Value Date   HGBA1C 5.2 05/24/2022    Last CBC Lab Results  Component Value Date   WBC 5.9 05/24/2022   HGB 15.1 05/24/2022   HCT 43.8 05/24/2022   MCV 84.5 05/24/2022   MCH 28.7 12/31/2019   RDW 12.8 05/24/2022   PLT 219.0 05/24/2022    Lab Results  Component Value Date   TSH 1.47 05/24/2022     Last vitamin D No results found for: "25OHVITD2", "25OHVITD3", "VD25OH"  Lab Results  Component Value Date   BILIRUBINUR NEGATIVE 12/01/2013   PROTEINUR NEGATIVE 12/01/2013   UROBILINOGEN 0.2 12/01/2013   LEUKOCYTESUR NEGATIVE 12/01/2013    Lab Results  Component Value Date   MICROALBUR 1.2 05/24/2022     At today's visit, we discussed treatment options, associated risk and benefits, and engage in counseling as needed.  Additionally the following were reviewed: Past medical records, past medical and surgical history, family and social background, as well as relevant laboratory results, imaging findings, and specialty notes, where applicable.  This  message was generated using dictation software, and as a result, it may contain unintentional typos or errors.  Nevertheless, extensive effort was made to accurately convey at the pertinent aspects of the patient visit.    There may have been are other unrelated non-urgent complaints, but due to the busy schedule and the amount of time already spent with him, time does not permit to address these issues at today's visit. Another appointment may have or has been requested to review these additional issues.     Thomes Dinning, MD, MS

## 2023-08-01 NOTE — Assessment & Plan Note (Signed)
Schedule fasting lipid panel to reassess cholesterol levels. Advise dietary modifications to improve lipid profile.

## 2023-08-01 NOTE — Assessment & Plan Note (Signed)
Chronic Condition Status: Stable.  Differential Diagnosis:   Migraine without aura. Tension-type headaches. Refractive errors due to overdue eye exam.  Plan: Advise scheduling an eye exam due to time since last visit. Recommend monitoring headache frequency and characteristics. Continue using ibuprofen as needed. Discuss sleep hygiene and consider reducing caffeine intake. Follow up if headaches become more frequent, severe, or new symptoms develop.

## 2023-08-01 NOTE — Assessment & Plan Note (Signed)
Improving. Reports intermittent epigastric discomfort relieved by Tums and cucumbers.  Plan:  Recommend lifestyle modifications: reduce caffeine intake, avoid trigger foods, avoid late meals. Consider weight loss to reduce symptoms. Patient prefers to monitor symptoms before initiating medication. Reevaluate if symptoms persist or worsen.

## 2023-08-01 NOTE — Assessment & Plan Note (Signed)
Worsening.  Plan:  Encourage continuation of regular exercise regimen. Discuss dietary modifications to support weight loss. Emphasize importance of weight reduction for health benefits.

## 2024-02-11 ENCOUNTER — Emergency Department (HOSPITAL_COMMUNITY)
Admission: EM | Admit: 2024-02-11 | Discharge: 2024-02-11 | Disposition: A | Discharge status: Home / Self Care | Attending: Emergency Medicine | Admitting: Emergency Medicine

## 2024-02-11 ENCOUNTER — Encounter (HOSPITAL_COMMUNITY): Payer: Self-pay

## 2024-02-11 ENCOUNTER — Emergency Department (HOSPITAL_COMMUNITY)

## 2024-02-11 DIAGNOSIS — R7401 Elevation of levels of liver transaminase levels: Secondary | ICD-10-CM | POA: Diagnosis not present

## 2024-02-11 DIAGNOSIS — K8020 Calculus of gallbladder without cholecystitis without obstruction: Secondary | ICD-10-CM | POA: Insufficient documentation

## 2024-02-11 DIAGNOSIS — R109 Unspecified abdominal pain: Secondary | ICD-10-CM | POA: Diagnosis present

## 2024-02-11 LAB — URINALYSIS, ROUTINE W REFLEX MICROSCOPIC
Bilirubin Urine: NEGATIVE
Glucose, UA: NEGATIVE mg/dL
Hgb urine dipstick: NEGATIVE
Ketones, ur: NEGATIVE mg/dL
Leukocytes,Ua: NEGATIVE
Nitrite: NEGATIVE
Protein, ur: NEGATIVE mg/dL
Specific Gravity, Urine: 1.024 (ref 1.005–1.030)
pH: 6 (ref 5.0–8.0)

## 2024-02-11 LAB — COMPREHENSIVE METABOLIC PANEL WITH GFR
ALT: 66 U/L — ABNORMAL HIGH (ref 0–44)
AST: 105 U/L — ABNORMAL HIGH (ref 15–41)
Albumin: 4.9 g/dL (ref 3.5–5.0)
Alkaline Phosphatase: 59 U/L (ref 38–126)
Anion gap: 11 (ref 5–15)
BUN: 23 mg/dL — ABNORMAL HIGH (ref 6–20)
CO2: 25 mmol/L (ref 22–32)
Calcium: 9.7 mg/dL (ref 8.9–10.3)
Chloride: 105 mmol/L (ref 98–111)
Creatinine, Ser: 1.12 mg/dL (ref 0.61–1.24)
GFR, Estimated: >60 mL/min (ref >60)
Glucose, Bld: 113 mg/dL — ABNORMAL HIGH (ref 70–99)
Potassium: 4.2 mmol/L (ref 3.5–5.1)
Sodium: 141 mmol/L (ref 135–145)
Total Bilirubin: 0.7 mg/dL (ref 0.0–1.2)
Total Protein: 8 g/dL (ref 6.5–8.1)

## 2024-02-11 LAB — CBC
HCT: 48.2 % (ref 39.0–52.0)
Hemoglobin: 16.3 g/dL (ref 13.0–17.0)
MCH: 29.1 pg (ref 26.0–34.0)
MCHC: 33.8 g/dL (ref 30.0–36.0)
MCV: 85.9 fL (ref 80.0–100.0)
Platelets: 227 10*3/uL (ref 150–400)
RBC: 5.61 MIL/uL (ref 4.22–5.81)
RDW: 12.1 % (ref 11.5–15.5)
WBC: 8.1 10*3/uL (ref 4.0–10.5)
nRBC: 0 % (ref 0.0–0.2)

## 2024-02-11 LAB — LIPASE, BLOOD: Lipase: 56 U/L — ABNORMAL HIGH (ref 11–51)

## 2024-02-11 MED ORDER — IOHEXOL 300 MG/ML  SOLN
100.0000 mL | Freq: Once | INTRAMUSCULAR | Status: AC | PRN
Start: 2024-02-11 — End: 2024-02-11
  Administered 2024-02-11: 100 mL via INTRAVENOUS

## 2024-02-11 MED ORDER — MORPHINE SULFATE (PF) 4 MG/ML IV SOLN
4.0000 mg | Freq: Once | INTRAVENOUS | Status: AC
  Administered 2024-02-11: 4 mg via INTRAVENOUS
  Filled 2024-02-11: qty 1

## 2024-02-11 MED ORDER — ONDANSETRON HCL 4 MG/2ML IJ SOLN
4.0000 mg | Freq: Once | INTRAMUSCULAR | Status: AC
  Administered 2024-02-11: 4 mg via INTRAVENOUS
  Filled 2024-02-11: qty 2

## 2024-02-11 NOTE — Discharge Instructions (Addendum)
 Evaluation revealed that you have a gallstone but no inflammation of the gallbladder.  Recommendation by the surgeon is to follow-up with general surgery in the outpatient setting.  If you develop worsening abdominal pain, persistent nausea or vomiting, develop a fever, yellowing of the skin or any other concerning symptom please return to the ED for further evaluation.

## 2024-02-11 NOTE — ED Triage Notes (Signed)
 Abdominal pain and n/v that started around 0700 this morning, denies any diarrhea. Pt diaphoretic in triage.

## 2024-02-11 NOTE — ED Provider Notes (Signed)
 [place] EMERGENCY DEPARTMENT AT [facility] Provider Note   CSN: [id number] Arrival date & time: 02/11/24  9151     Patient presents with: Abdominal Pain  HPI Corey Cole is a 33 y.o. male presenting for abdominal pain.  It started around 7 AM this morning.  Located centrally in the abdomen but does not radiate to the back.  Endorses some nausea but no vomiting or diarrhea.  Denies chest pain shortness of breath.  He states that the pain lasted for about an hour and a half and then completely resolved.  He reports that he did go to the presenting steak house last night and ate lots of meat.    Abdominal Pain      Prior to Admission medications   Not on File    Allergies: Patient has no known allergies.    Review of Systems  Gastrointestinal:  Positive for abdominal pain.    Updated Vital Signs BP (!) 163/97 (BP Location: Right Arm)   Pulse (!) 50   Temp 98.1 F (36.7 C) (Oral)   Resp 20   Ht 5' 8 (1.727 m)   Wt 106.6 kg   SpO2 100%   BMI 35.73 kg/m   Physical Exam Vitals and nursing note reviewed.  HENT:     Head: Normocephalic and atraumatic.     Mouth/Throat:     Mouth: Mucous membranes are moist.   Eyes:     General:        Right eye: No discharge.        Left eye: No discharge.     Conjunctiva/sclera: Conjunctivae normal.    Cardiovascular:     Rate and Rhythm: Normal rate and regular rhythm.     Pulses: Normal pulses.     Heart sounds: Normal heart sounds.  Pulmonary:     Effort: Pulmonary effort is normal.     Breath sounds: Normal breath sounds.  Abdominal:     General: Abdomen is flat. There is no distension.     Palpations: Abdomen is soft.     Tenderness: There is no abdominal tenderness.   Skin:    General: Skin is warm and dry.   Neurological:     General: No focal deficit present.   Psychiatric:        Mood and Affect: Mood normal.     (all labs ordered are listed, but only abnormal results are  displayed) Labs Reviewed  LIPASE, BLOOD - Abnormal; Notable for the following components:      Result Value   Lipase 56 (*)    All other components within normal limits  COMPREHENSIVE METABOLIC PANEL WITH GFR - Abnormal; Notable for the following components:   Glucose, Bld 113 (*)    BUN 23 (*)    AST 105 (*)    ALT 66 (*)    All other components within normal limits  CBC  URINALYSIS, ROUTINE W REFLEX MICROSCOPIC    EKG: None  Radiology: US  Abdomen Limited RUQ (LIVER/GB) Result Date: 02/11/2024 CLINICAL DATA:  Abdominal pain and distended gallbladder EXAM: ULTRASOUND ABDOMEN LIMITED RIGHT UPPER QUADRANT COMPARISON:  Same day CT abdomen and pelvis FINDINGS: Gallbladder: Cholelithiasis measuring up to 2.4 cm. No wall thickening visualized. No sonographic Murphy sign noted by sonographer. Common bile duct: Diameter: 2 mm Liver: No focal lesion identified. Within normal limits in parenchymal echogenicity. Portal vein is patent on color Doppler imaging with normal direction of blood flow towards the liver. Other: None. IMPRESSION:  Cholelithiasis without sonographic evidence of acute cholecystitis. Electronically Signed   By: Limin  Xu M.D.   On: 02/11/2024 12:33   CT ABDOMEN PELVIS W CONTRAST Result Date: 02/11/2024 CLINICAL DATA:  Intermittent severe abdominal pain associated with nausea and vomiting EXAM: CT ABDOMEN AND PELVIS WITH CONTRAST TECHNIQUE: Multidetector CT imaging of the abdomen and pelvis was performed using the standard protocol following bolus administration of intravenous contrast. RADIATION DOSE REDUCTION: This exam was performed according to the departmental dose-optimization program which includes automated exposure control, adjustment of the mA and/or kV according to patient size and/or use of iterative reconstruction technique. CONTRAST:  OMNIPAQUE IOHEXOL 300 MG/ML  SOLN COMPARISON:  CT abdomen and pelvis dated 12/01/2013 FINDINGS: Lower chest: No focal consolidation  or pulmonary nodule in the lung bases. No pleural effusion or pneumothorax demonstrated. Partially imaged heart size is normal. Hepatobiliary: No focal hepatic lesions. No intra or extrahepatic biliary ductal dilation. Mildly distended gallbladder with mild mural thickening. Pancreas: No focal lesions or main ductal dilation. Spleen: Normal in size without focal abnormality. Adrenals/Urinary Tract: No adrenal nodules. Left anterior upper pole renal cortical scarring. No suspicious renal mass, calculi or hydronephrosis. No focal bladder wall thickening. Stomach/Bowel: Normal appearance of the stomach. No evidence of bowel wall thickening, distention, or inflammatory changes. Normal appendix. Vascular/Lymphatic: No significant vascular findings are present. No enlarged abdominal or pelvic lymph nodes. Reproductive: Prostate is unremarkable. Other: No free fluid, fluid collection, or free air. Musculoskeletal: No acute or abnormal lytic or blastic osseous lesions. IMPRESSION: 1. Mildly distended gallbladder with mild mural thickening, which may be seen with acute cholecystitis. Recommend further evaluation with right upper quadrant ultrasound. 2. Otherwise no acute abdominopelvic findings. Electronically Signed   By: Limin  Xu M.D.   On: 02/11/2024 11:42     Procedures   Medications Ordered in the ED  morphine (PF) 4 MG/ML injection 4 mg (4 mg Intravenous Given 02/11/24 1041)  ondansetron (ZOFRAN) injection 4 mg (4 mg Intravenous Given 02/11/24 1041)  iohexol (OMNIPAQUE) 300 MG/ML solution 100 mL (100 mLs Intravenous Contrast Given 02/11/24 1118)    Clinical Course as of 02/11/24 1331  Sun Feb 11, 2024  1159 MCHC: 33.8 [JR]  1217 Discussed patient with Dr. Bernarda Ned of general surgery.  She recommended ultrasound and if unremarkable he is appropriate for discharge and general surgery follow-up. [JR]    Clinical Course User Index [JR] Lang Norleen POUR, PA-C                                 Medical  Decision Making Amount and/or Complexity of Data Reviewed Labs: ordered. Decision-making details documented in ED Course. Radiology: ordered.  Risk Prescription drug management.   Initial Impression and Ddx 33 year old well-appearing male presenting for abdominal pain.  Exam notable was unremarkable.  Shortly after my encounter with him he stated that the pain returned this prompted further characterization with CT scan.  DDx includes gallbladder pathology, appendicitis, kidney stone, pyelonephritis, ACS, other. Patient PMH that increases complexity of ED encounter:  none  Interpretation of Diagnostics - I independent reviewed and interpreted the labs as followed: Elevated AST/ALT, elevated lipase  - I independently visualized the following imaging with scope of interpretation limited to determining acute life threatening conditions related to emergency care: CT abpelvis, which revealed findings suggestive of acute cholecystitis.  Right upper quadrant ultrasound revealed cholelithiasis but no inflammation of the gallbladder.  Shared these findings  with patient.  -I personally reviewed interpret EKG which revealed sinus rhythm.  Patient Reassessment and Ultimate Disposition/Management On reassessment, pain was well-controlled.  Discussed patient with Dr. Bernarda Ned who recommended the ultrasound.  She advised that if unremarkable he could be discharged with general surgery follow-up as this is likely a mild form of biliary colic.  Ultrasound was unremarkable.  Fluid challenge with no issue.  Advised to follow-up with general surgery and discuss strict return precautions.  Discharged in good condition.  Patient management required discussion with the following services or consulting groups:  General/Trauma Surgery  Complexity of Problems Addressed Acute complicated illness or Injury  Additional Data Reviewed and Analyzed Further history obtained from: Further history from spouse/family  member, Past medical history and medications listed in the EMR, and Prior ED visit notes  Patient Encounter Risk Assessment Consideration of hospitalization      Final diagnoses:  Calculus of gallbladder without cholecystitis without obstruction    ED Discharge Orders     None          Lang Norleen POUR, PA-C 02/11/24 1333    Dreama Longs, MD 02/11/24 2339

## 2024-02-14 ENCOUNTER — Ambulatory Visit: Admitting: Family Medicine

## 2024-02-14 ENCOUNTER — Encounter: Payer: Self-pay | Admitting: Family Medicine

## 2024-02-14 VITALS — BP 124/72 | HR 47 | Temp 98.1°F | Ht 68.0 in | Wt 222.8 lb

## 2024-02-14 DIAGNOSIS — K8020 Calculus of gallbladder without cholecystitis without obstruction: Secondary | ICD-10-CM | POA: Diagnosis not present

## 2024-02-14 NOTE — Progress Notes (Signed)
 Assessment & Plan   Assessment/Plan:    Assessment & Plan Cholelithiasis He presents with gallstone-induced pain, confirmed by CT and ultrasound. The gallstone measures 2.4 cm, significant relative to the gallbladder size. He experiences intermittent pain, not currently severe, but with potential for surgical emergency if obstruction occurs, leading to inflammation or infection. He plans to travel overseas soon and is concerned about surgery timing. Non-surgical options like lithotripsy are less effective. Laparoscopic cholecystectomy is recommended for its quick recovery, but requires surgical follow-up for complications. - Refer to a surgeon for evaluation and potential cholecystectomy. - Advise dietary modifications to avoid fatty foods and other triggers to reduce gallbladder stimulation and pain. - Recommend over-the-counter pain management with ibuprofen or acetaminophen  as needed. - Instruct to seek immediate medical attention if severe abdominal pain occurs, indicating a surgical emergency.        There are no discontinued medications.          Subjective:   Encounter date: 02/14/2024  Corey Cole is a 33 y.o. male who has Class 1 obesity without serious comorbidity with body mass index (BMI) of 34.0 to 34.9 in adult; Moderate mixed hyperlipidemia not requiring statin therapy; Myopia of both eyes; Chronic nonintractable headache; Gastroesophageal reflux disease; and Calculus of gallbladder without cholecystitis without obstruction on their problem list..   He  has no past medical history on file.SABRA   He presents with chief complaint of Abdominal Pain (Follow up from ED visit gallbladder ) .   Discussed the use of AI scribe software for clinical note transcription with the patient, who gave verbal consent to proceed.  History of Present Illness Corey Cole is a 33 year old male who presents with recurrent abdominal pain due to gallstones.  He experiences recurrent  episodes of abdominal pain, which led him to seek emergency care. Imaging studies during this visit revealed a gallstone approximately 2.4 centimeters in size within the gallbladder. The pain is intermittent and not consistently severe, but was significant enough on a recent Sunday to prompt the emergency visit.  He describes the pain as intermittent and not as severe as the episode that led to the emergency visit. He has not been taking any medication for pain management.  He is planning to travel overseas to [place] on July 6 and is concerned about the timing of potential surgical intervention. He is seeking an earlier appointment due to his travel plans.     ROS  No past surgical history on file.  No outpatient medications prior to visit.   No facility-administered medications prior to visit.    Family History  Problem Relation Age of Onset   High blood pressure Mother    High Cholesterol Mother    Diabetes Mother    High blood pressure Father    High Cholesterol Father     Social History   Socioeconomic History   Marital status: Married    Spouse name: Not on file   Number of children: Not on file   Years of education: Not on file   Highest education level: Not on file  Occupational History   Not on file  Tobacco Use   Smoking status: Never    Passive exposure: Never   Smokeless tobacco: Not on file  Vaping Use   Vaping status: Never Used  Substance and Sexual Activity   Alcohol use: No   Drug use: No   Sexual activity: Not on file  Other Topics Concern   Not on file  Social History Narrative   Not on file   Social Drivers of Health   Financial Resource Strain: Not on file  Food Insecurity: Not on file  Transportation Needs: Not on file  Physical Activity: Not on file  Stress: Not on file  Social Connections: Not on file  Intimate Partner Violence: Not on file                                                                                                   Objective:  Physical Exam: BP 124/72   Pulse (!) 47   Temp 98.1 F (36.7 C) (Temporal)   Ht 5' 8 (1.727 m)   Wt 222 lb 12.8 oz (101.1 kg)   SpO2 97%   BMI 33.88 kg/m    Physical Exam GENERAL: Alert, cooperative, well developed, no acute distress HEENT: Normocephalic, normal oropharynx, moist mucous membranes CHEST: Clear to auscultation bilaterally, No wheezes, rhonchi, or crackles CARDIOVASCULAR: Normal heart rate and rhythm, S1 and S2 normal without murmurs ABDOMEN: Soft, mild RUQ tenderness, non-distended, without organomegaly, Normal bowel sounds EXTREMITIES: No cyanosis or edema NEUROLOGICAL: Cranial nerves grossly intact, Moves all extremities without gross motor or sensory deficit   Physical Exam  US  Abdomen Limited RUQ (LIVER/GB) Result Date: 02/11/2024 CLINICAL DATA:  Abdominal pain and distended gallbladder EXAM: ULTRASOUND ABDOMEN LIMITED RIGHT UPPER QUADRANT COMPARISON:  Same day CT abdomen and pelvis FINDINGS: Gallbladder: Cholelithiasis measuring up to 2.4 cm. No wall thickening visualized. No sonographic Murphy sign noted by sonographer. Common bile duct: Diameter: 2 mm Liver: No focal lesion identified. Within normal limits in parenchymal echogenicity. Portal vein is patent on color Doppler imaging with normal direction of blood flow towards the liver. Other: None. IMPRESSION: Cholelithiasis without sonographic evidence of acute cholecystitis. Electronically Signed   By: Limin  Xu M.D.   On: 02/11/2024 12:33   CT ABDOMEN PELVIS W CONTRAST Result Date: 02/11/2024 CLINICAL DATA:  Intermittent severe abdominal pain associated with nausea and vomiting EXAM: CT ABDOMEN AND PELVIS WITH CONTRAST TECHNIQUE: Multidetector CT imaging of the abdomen and pelvis was performed using the standard protocol following bolus administration of intravenous contrast. RADIATION DOSE REDUCTION: This exam was performed according to the departmental dose-optimization program which includes  automated exposure control, adjustment of the mA and/or kV according to patient size and/or use of iterative reconstruction technique. CONTRAST:  OMNIPAQUE IOHEXOL 300 MG/ML  SOLN COMPARISON:  CT abdomen and pelvis dated 12/01/2013 FINDINGS: Lower chest: No focal consolidation or pulmonary nodule in the lung bases. No pleural effusion or pneumothorax demonstrated. Partially imaged heart size is normal. Hepatobiliary: No focal hepatic lesions. No intra or extrahepatic biliary ductal dilation. Mildly distended gallbladder with mild mural thickening. Pancreas: No focal lesions or main ductal dilation. Spleen: Normal in size without focal abnormality. Adrenals/Urinary Tract: No adrenal nodules. Left anterior upper pole renal cortical scarring. No suspicious renal mass, calculi or hydronephrosis. No focal bladder wall thickening. Stomach/Bowel: Normal appearance of the stomach. No evidence of bowel wall thickening, distention, or inflammatory changes. Normal appendix. Vascular/Lymphatic: No significant vascular findings are present. No enlarged abdominal or pelvic  lymph nodes. Reproductive: Prostate is unremarkable. Other: No free fluid, fluid collection, or free air. Musculoskeletal: No acute or abnormal lytic or blastic osseous lesions. IMPRESSION: 1. Mildly distended gallbladder with mild mural thickening, which may be seen with acute cholecystitis. Recommend further evaluation with right upper quadrant ultrasound. 2. Otherwise no acute abdominopelvic findings. Electronically Signed   By: Limin  Xu M.D.   On: 02/11/2024 11:42    Recent Results (from the past 2160 hours)  Lipase, blood     Status: Abnormal   Collection Time: 02/11/24 10:12 AM  Result Value Ref Range   Lipase 56 (H) 11 - 51 U/L    Comment: Performed at [facility], [address], [place], [place] [address]  Comprehensive metabolic panel     Status: Abnormal   Collection Time: 02/11/24 10:12 AM  Result Value Ref  Range   Sodium 141 135 - 145 mmol/L   Potassium 4.2 3.5 - 5.1 mmol/L   Chloride 105 98 - 111 mmol/L   CO2 25 22 - 32 mmol/L   Glucose, Bld 113 (H) 70 - 99 mg/dL    Comment: Glucose reference range applies only to samples taken after fasting for at least 8 hours.   BUN 23 (H) 6 - 20 mg/dL   Creatinine, Ser 8.87 0.61 - 1.24 mg/dL   Calcium 9.7 8.9 - 89.6 mg/dL   Total Protein 8.0 6.5 - 8.1 g/dL   Albumin 4.9 3.5 - 5.0 g/dL   AST 894 (H) 15 - 41 U/L   ALT 66 (H) 0 - 44 U/L   Alkaline Phosphatase 59 38 - 126 U/L   Total Bilirubin 0.7 0.0 - 1.2 mg/dL   GFR, Estimated >39 >39 mL/min    Comment: (NOTE) Calculated using the CKD-EPI Creatinine Equation (2021)    Anion gap 11 5 - 15    Comment: Performed at [facility], [address] [place], [place] [address]  CBC     Status: None   Collection Time: 02/11/24 10:12 AM  Result Value Ref Range   WBC 8.1 4.0 - 10.5 K/uL   RBC 5.61 4.22 - 5.81 MIL/uL   Hemoglobin 16.3 13.0 - 17.0 g/dL   HCT 51.7 60.9 - 47.9 %   MCV 85.9 80.0 - 100.0 fL   MCH 29.1 26.0 - 34.0 pg   MCHC 33.8 30.0 - 36.0 g/dL   RDW 87.8 88.4 - 84.4 %   Platelets 227 150 - 400 K/uL   nRBC 0.0 0.0 - 0.2 %    Comment: Performed at [facility], [address], [place], [place] [address]  Urinalysis, Routine w reflex microscopic -Urine, Clean Catch     Status: None   Collection Time: 02/11/24 12:21 PM  Result Value Ref Range   Color, Urine YELLOW YELLOW   APPearance CLEAR CLEAR   Specific Gravity, Urine 1.024 1.005 - 1.030   pH 6.0 5.0 - 8.0   Glucose, UA NEGATIVE NEGATIVE mg/dL   Hgb urine dipstick NEGATIVE NEGATIVE   Bilirubin Urine NEGATIVE NEGATIVE   Ketones, ur NEGATIVE NEGATIVE mg/dL   Protein, ur NEGATIVE NEGATIVE mg/dL   Nitrite NEGATIVE NEGATIVE   Leukocytes,Ua NEGATIVE NEGATIVE    Comment: Performed at [facility], [address] [place], [place] [address]        Beverley Adine Hummer, MD, MS

## 2024-02-21 ENCOUNTER — Other Ambulatory Visit: Payer: Self-pay

## 2024-02-21 ENCOUNTER — Emergency Department (HOSPITAL_BASED_OUTPATIENT_CLINIC_OR_DEPARTMENT_OTHER)

## 2024-02-21 ENCOUNTER — Encounter (HOSPITAL_BASED_OUTPATIENT_CLINIC_OR_DEPARTMENT_OTHER): Payer: Self-pay | Admitting: Emergency Medicine

## 2024-02-21 ENCOUNTER — Emergency Department (HOSPITAL_BASED_OUTPATIENT_CLINIC_OR_DEPARTMENT_OTHER): Admission: EM | Admit: 2024-02-21 | Discharge: 2024-02-21 | Disposition: A | Discharge status: Home / Self Care

## 2024-02-21 DIAGNOSIS — K8020 Calculus of gallbladder without cholecystitis without obstruction: Secondary | ICD-10-CM | POA: Diagnosis not present

## 2024-02-21 DIAGNOSIS — R109 Unspecified abdominal pain: Secondary | ICD-10-CM | POA: Diagnosis present

## 2024-02-21 LAB — CBC
HCT: 43.6 % (ref 39.0–52.0)
Hemoglobin: 15.2 g/dL (ref 13.0–17.0)
MCH: 29 pg (ref 26.0–34.0)
MCHC: 34.9 g/dL (ref 30.0–36.0)
MCV: 83 fL (ref 80.0–100.0)
Platelets: 247 10*3/uL (ref 150–400)
RBC: 5.25 MIL/uL (ref 4.22–5.81)
RDW: 11.9 % (ref 11.5–15.5)
WBC: 5.3 10*3/uL (ref 4.0–10.5)
nRBC: 0 % (ref 0.0–0.2)

## 2024-02-21 LAB — COMPREHENSIVE METABOLIC PANEL WITH GFR
ALT: 28 U/L (ref 0–44)
AST: 19 U/L (ref 15–41)
Albumin: 4.7 g/dL (ref 3.5–5.0)
Alkaline Phosphatase: 62 U/L (ref 38–126)
Anion gap: 11 (ref 5–15)
BUN: 13 mg/dL (ref 6–20)
CO2: 26 mmol/L (ref 22–32)
Calcium: 9.4 mg/dL (ref 8.9–10.3)
Chloride: 100 mmol/L (ref 98–111)
Creatinine, Ser: 1.1 mg/dL (ref 0.61–1.24)
GFR, Estimated: >60 mL/min (ref >60)
Glucose, Bld: 93 mg/dL (ref 70–99)
Potassium: 4.3 mmol/L (ref 3.5–5.1)
Sodium: 136 mmol/L (ref 135–145)
Total Bilirubin: 0.5 mg/dL (ref 0.0–1.2)
Total Protein: 7.1 g/dL (ref 6.5–8.1)

## 2024-02-21 LAB — LIPASE, BLOOD: Lipase: 46 U/L (ref 11–51)

## 2024-02-21 MED ORDER — ONDANSETRON 4 MG PO TBDP: 4.0000 mg | ORAL_TABLET | Freq: Three times a day (TID) | ORAL | 0 refills | Status: DC | PRN

## 2024-02-21 MED ORDER — OXYCODONE HCL 5 MG PO TABS: 5.0000 mg | ORAL_TABLET | ORAL | 0 refills | Status: DC | PRN

## 2024-02-21 NOTE — Discharge Instructions (Addendum)
 Please call to make an appointment with [facility] surgery as soon as you are able to.  Use Zofran  as needed for nausea/the feeling of needing to vomit.  Please read the gallbladder eating plan and follow this.  For pain please follow the below instructions.  Please use Tylenol  or ibuprofen for pain.  You may use 600 mg ibuprofen every 6 hours or 1000 mg of Tylenol  every 6 hours.  You may choose to alternate between the 2.  This would be most effective.  Not to exceed 4 g of Tylenol  within 24 hours.  Not to exceed 3200 mg ibuprofen 24 hours.   For breakthrough pain you may use Roxicodone as prescribed please use only if needed.  Please return the emergency room for persistent worsening pain, vomiting that does not stop with nausea medicine, yellowing of your eyes or any other new or concerning symptom such as fever or confusion.

## 2024-02-21 NOTE — ED Triage Notes (Signed)
 Recurrent and intermittent RUQ pain that radiated sometimes to RLQ . Denies NVD.  Denies urinary symptoms , no Hx kidney .

## 2024-02-21 NOTE — ED Provider Notes (Signed)
 [place] EMERGENCY DEPARTMENT AT [facility] Provider Note   CSN: [id number] Arrival date & time: 02/21/24  1410     Patient presents with: Abdominal Pain   Corey Cole is a 33 y.o. male.    Abdominal Pain  Patient is a 33 year old male with past medical history significant for cholelithiasis diagnosed years ago.   He has had several recent flares of pain prompting him to come to the emergency department.  Today he had episode of pain that lasted for an hour which prompted him to come to emergency room.  No nausea vomiting no diarrhea no fevers no chills no lightheadedness or dizziness.  Denies any urinary frequency urgency dysuria hematuria.  At the time of my evaluation of symptoms have completely resolved.     Prior to Admission medications   Medication Sig Start Date End Date Taking? Authorizing Provider  ondansetron  (ZOFRAN -ODT) 4 MG disintegrating tablet Take 1 tablet (4 mg total) by mouth every 8 (eight) hours as needed for nausea or vomiting. 02/21/24  Yes Neldon Hamp RAMAN, PA    Allergies: Patient has no known allergies.    Review of Systems  Gastrointestinal:  Positive for abdominal pain.    Updated Vital Signs BP 126/84   Pulse (!) 51   Temp 97.8 F (36.6 C)   Resp 16   Wt 99.8 kg   SpO2 100%   BMI 33.45 kg/m   Physical Exam Vitals and nursing note reviewed.  Constitutional:      General: He is not in acute distress. HENT:     Head: Normocephalic and atraumatic.     Nose: Nose normal.  Eyes:     General: No scleral icterus. Cardiovascular:     Rate and Rhythm: Normal rate and regular rhythm.     Pulses: Normal pulses.     Heart sounds: Normal heart sounds.  Pulmonary:     Effort: Pulmonary effort is normal. No respiratory distress.     Breath sounds: No wheezing.  Abdominal:     Palpations: Abdomen is soft.     Tenderness: There is no abdominal tenderness.     Comments: No abdominal tenderness.  No guarding or rebound. Negative  Murphy sign.   Musculoskeletal:     Cervical back: Normal range of motion.     Right lower leg: No edema.     Left lower leg: No edema.  Skin:    General: Skin is warm and dry.     Capillary Refill: Capillary refill takes less than 2 seconds.  Neurological:     Mental Status: He is alert. Mental status is at baseline.  Psychiatric:        Mood and Affect: Mood normal.        Behavior: Behavior normal.     (all labs ordered are listed, but only abnormal results are displayed) Labs Reviewed  LIPASE, BLOOD  COMPREHENSIVE METABOLIC PANEL WITH GFR  CBC  URINALYSIS, ROUTINE W REFLEX MICROSCOPIC    EKG: None  Radiology: US  Abdomen Limited RUQ (LIVER/GB) Result Date: 02/21/2024 CLINICAL DATA:  Right upper quadrant pain EXAM: ULTRASOUND ABDOMEN LIMITED RIGHT UPPER QUADRANT COMPARISON:  02/11/2024 ultrasound and CT with contrast FINDINGS: Gallbladder: Distended gallbladder. Multiple layering stones. No wall thickening or adjacent fluid. No reported Murphy's sign. Common bile duct: Diameter: 3 mm Liver: No focal lesion identified. Within normal limits in parenchymal echogenicity. Portal vein is patent on color Doppler imaging with normal direction of blood flow towards the liver. Other: None.  IMPRESSION: Multiple gallstones. No further sonographic evidence of acute cholecystitis at this time. No biliary ductal dilatation. Electronically Signed   By: Ranell Bring M.D.   On: 02/21/2024 15:51     Procedures   Medications Ordered in the ED - No data to display                                  Medical Decision Making Amount and/or Complexity of Data Reviewed Labs: ordered. Radiology: ordered.  Risk Prescription drug management.   Patient is a 33 year old male with past medical history significant for cholelithiasis diagnosed years ago.   He has had several recent flares of pain prompting him to come to the emergency department.  Today he had episode of pain that lasted for an hour  which prompted him to come to emergency room.  No nausea vomiting no diarrhea no fevers no chills no lightheadedness or dizziness.  Denies any urinary frequency urgency dysuria hematuria.  At the time of my evaluation of symptoms have completely resolved.  CBC without leukocytosis or anemia, lipase normal, CMP unremarkable  Abdominal exam is benign  Patient given recommendations to follow-up with general surgery.  Given the gallbladder eating plan print out.  Given few tablets of oxycodone for breakthrough pain and Zofran  for any nausea.  Return to the urgency room for persistent new or worsening symptoms any fevers jaundice yellowing of the eyes confusion or any other new or concerning symptoms.  Otherwise follow-up with general surgery for outpatient evaluation and consideration for cholecystectomy.  Final diagnoses:  Calculus of gallbladder without cholecystitis without obstruction    ED Discharge Orders          Ordered    ondansetron  (ZOFRAN -ODT) 4 MG disintegrating tablet  Every 8 hours PRN        02/21/24 1711               Neldon Hamp RAMAN, [place] 02/21/24 1724    Neysa Caron PARAS, DO 02/21/24 2310

## 2024-03-22 ENCOUNTER — Ambulatory Visit: Payer: Self-pay | Admitting: Surgery

## 2024-04-03 ENCOUNTER — Other Ambulatory Visit: Payer: Self-pay

## 2024-04-03 ENCOUNTER — Encounter (HOSPITAL_BASED_OUTPATIENT_CLINIC_OR_DEPARTMENT_OTHER): Payer: Self-pay | Admitting: Surgery

## 2024-04-10 ENCOUNTER — Other Ambulatory Visit: Payer: Self-pay

## 2024-04-10 ENCOUNTER — Ambulatory Visit (HOSPITAL_BASED_OUTPATIENT_CLINIC_OR_DEPARTMENT_OTHER): Admitting: Anesthesiology

## 2024-04-10 ENCOUNTER — Encounter (HOSPITAL_BASED_OUTPATIENT_CLINIC_OR_DEPARTMENT_OTHER): Payer: Self-pay | Admitting: Surgery

## 2024-04-10 ENCOUNTER — Ambulatory Visit (HOSPITAL_BASED_OUTPATIENT_CLINIC_OR_DEPARTMENT_OTHER)
Admission: RE | Admit: 2024-04-10 | Discharge: 2024-04-10 | Disposition: A | Discharge status: Home / Self Care | Attending: Surgery | Admitting: Surgery

## 2024-04-10 ENCOUNTER — Encounter (HOSPITAL_BASED_OUTPATIENT_CLINIC_OR_DEPARTMENT_OTHER)
Admission: RE | Disposition: A | Discharge status: Home / Self Care | Payer: Self-pay | Source: Home / Self Care | Attending: Surgery

## 2024-04-10 DIAGNOSIS — R519 Headache, unspecified: Secondary | ICD-10-CM | POA: Diagnosis not present

## 2024-04-10 DIAGNOSIS — Z79899 Other long term (current) drug therapy: Secondary | ICD-10-CM | POA: Insufficient documentation

## 2024-04-10 DIAGNOSIS — K8010 Calculus of gallbladder with chronic cholecystitis without obstruction: Secondary | ICD-10-CM | POA: Diagnosis present

## 2024-04-10 DIAGNOSIS — E669 Obesity, unspecified: Secondary | ICD-10-CM | POA: Insufficient documentation

## 2024-04-10 DIAGNOSIS — K219 Gastro-esophageal reflux disease without esophagitis: Secondary | ICD-10-CM | POA: Insufficient documentation

## 2024-04-10 HISTORY — PX: CHOLECYSTECTOMY: SHX55

## 2024-04-10 SURGERY — LAPAROSCOPIC CHOLECYSTECTOMY
Anesthesia: General

## 2024-04-10 MED ORDER — 0.9 % SODIUM CHLORIDE (POUR BTL) OPTIME
TOPICAL | Status: DC | PRN
Start: 2024-04-10 — End: 2024-04-10
  Administered 2024-04-10: 1000 mL

## 2024-04-10 MED ORDER — LACTATED RINGERS IV SOLN: INTRAVENOUS | Status: DC

## 2024-04-10 MED ORDER — OXYCODONE HCL 5 MG/5ML PO SOLN: 5.0000 mg | Freq: Once | ORAL | Status: AC | PRN

## 2024-04-10 MED ORDER — SODIUM CHLORIDE 0.9 % IR SOLN
Status: DC | PRN
  Administered 2024-04-10: 1

## 2024-04-10 MED ORDER — BUPIVACAINE-EPINEPHRINE (PF) 0.25% -1:200000 IJ SOLN
INTRAMUSCULAR | Status: DC | PRN
  Administered 2024-04-10: 30 mL

## 2024-04-10 MED ORDER — MEPERIDINE HCL 25 MG/ML IJ SOLN: 6.2500 mg | INTRAMUSCULAR | Status: DC | PRN

## 2024-04-10 MED ORDER — ACETAMINOPHEN 500 MG PO TABS
ORAL_TABLET | ORAL | Status: AC
  Filled 2024-04-10: qty 2

## 2024-04-10 MED ORDER — HYDROMORPHONE HCL 1 MG/ML IJ SOLN
INTRAMUSCULAR | Status: AC
Start: 2024-04-10 — End: 2024-04-10
  Filled 2024-04-10: qty 0.5

## 2024-04-10 MED ORDER — MIDAZOLAM HCL 2 MG/2ML IJ SOLN
INTRAMUSCULAR | Status: AC
Start: 2024-04-10 — End: 2024-04-10
  Filled 2024-04-10: qty 2

## 2024-04-10 MED ORDER — KETOROLAC TROMETHAMINE 15 MG/ML IJ SOLN: 15.0000 mg | INTRAMUSCULAR | Status: DC

## 2024-04-10 MED ORDER — CEFAZOLIN SODIUM-DEXTROSE 2-4 GM/100ML-% IV SOLN
2.0000 g | INTRAVENOUS | Status: AC
  Administered 2024-04-10: 2 g via INTRAVENOUS

## 2024-04-10 MED ORDER — SUGAMMADEX SODIUM 200 MG/2ML IV SOLN
INTRAVENOUS | Status: DC | PRN
  Administered 2024-04-10: 200 mg via INTRAVENOUS

## 2024-04-10 MED ORDER — KETOROLAC TROMETHAMINE 30 MG/ML IJ SOLN
INTRAMUSCULAR | Status: DC | PRN
  Administered 2024-04-10: 30 mg via INTRAVENOUS

## 2024-04-10 MED ORDER — GABAPENTIN 300 MG PO CAPS
300.0000 mg | ORAL_CAPSULE | ORAL | Status: AC
  Administered 2024-04-10: 300 mg via ORAL

## 2024-04-10 MED ORDER — SODIUM CHLORIDE 0.9 % IV SOLN: 12.5000 mg | INTRAVENOUS | Status: DC | PRN

## 2024-04-10 MED ORDER — PROPOFOL 10 MG/ML IV BOLUS
INTRAVENOUS | Status: DC | PRN
  Administered 2024-04-10: 180 mg via INTRAVENOUS

## 2024-04-10 MED ORDER — DEXAMETHASONE SODIUM PHOSPHATE 10 MG/ML IJ SOLN
INTRAMUSCULAR | Status: AC
  Filled 2024-04-10: qty 1

## 2024-04-10 MED ORDER — CHLORHEXIDINE GLUCONATE CLOTH 2 % EX PADS: 6.0000 | MEDICATED_PAD | Freq: Once | CUTANEOUS | Status: DC

## 2024-04-10 MED ORDER — EPHEDRINE SULFATE (PRESSORS) 50 MG/ML IJ SOLN
INTRAMUSCULAR | Status: DC | PRN
  Administered 2024-04-10 (×2): 10 mg via INTRAVENOUS

## 2024-04-10 MED ORDER — DEXAMETHASONE SODIUM PHOSPHATE 4 MG/ML IJ SOLN
INTRAMUSCULAR | Status: DC | PRN
  Administered 2024-04-10: 10 mg via INTRAVENOUS

## 2024-04-10 MED ORDER — CEFAZOLIN SODIUM-DEXTROSE 2-4 GM/100ML-% IV SOLN
INTRAVENOUS | Status: AC
  Filled 2024-04-10: qty 100

## 2024-04-10 MED ORDER — DEXMEDETOMIDINE HCL IN NACL 80 MCG/20ML IV SOLN
INTRAVENOUS | Status: DC | PRN
  Administered 2024-04-10: 8 ug via INTRAVENOUS

## 2024-04-10 MED ORDER — ACETAMINOPHEN 500 MG PO TABS
1000.0000 mg | ORAL_TABLET | ORAL | Status: AC
  Administered 2024-04-10: 1000 mg via ORAL

## 2024-04-10 MED ORDER — HYDROMORPHONE HCL 1 MG/ML IJ SOLN
0.2500 mg | INTRAMUSCULAR | Status: DC | PRN
  Administered 2024-04-10 (×2): 0.5 mg via INTRAVENOUS

## 2024-04-10 MED ORDER — MIDAZOLAM HCL 5 MG/5ML IJ SOLN
INTRAMUSCULAR | Status: DC | PRN
  Administered 2024-04-10: 2 mg via INTRAVENOUS

## 2024-04-10 MED ORDER — GABAPENTIN 300 MG PO CAPS
ORAL_CAPSULE | ORAL | Status: AC
  Filled 2024-04-10: qty 1

## 2024-04-10 MED ORDER — KETOROLAC TROMETHAMINE 15 MG/ML IJ SOLN
INTRAMUSCULAR | Status: AC
  Filled 2024-04-10: qty 1

## 2024-04-10 MED ORDER — ONDANSETRON HCL 4 MG/2ML IJ SOLN
INTRAMUSCULAR | Status: AC
  Filled 2024-04-10: qty 2

## 2024-04-10 MED ORDER — HYDROMORPHONE HCL 1 MG/ML IJ SOLN
INTRAMUSCULAR | Status: AC
  Filled 2024-04-10: qty 0.5

## 2024-04-10 MED ORDER — PROPOFOL 10 MG/ML IV BOLUS
INTRAVENOUS | Status: AC
  Filled 2024-04-10: qty 20

## 2024-04-10 MED ORDER — FENTANYL CITRATE (PF) 100 MCG/2ML IJ SOLN
INTRAMUSCULAR | Status: AC
  Filled 2024-04-10: qty 2

## 2024-04-10 MED ORDER — OXYCODONE HCL 5 MG PO TABS
ORAL_TABLET | ORAL | Status: AC
Start: 2024-04-10 — End: 2024-04-10
  Filled 2024-04-10: qty 1

## 2024-04-10 MED ORDER — AMISULPRIDE (ANTIEMETIC) 5 MG/2ML IV SOLN: 10.0000 mg | Freq: Once | INTRAVENOUS | Status: DC | PRN

## 2024-04-10 MED ORDER — LIDOCAINE 2% (20 MG/ML) 5 ML SYRINGE
INTRAMUSCULAR | Status: AC
  Filled 2024-04-10: qty 5

## 2024-04-10 MED ORDER — LIDOCAINE HCL (CARDIAC) PF 100 MG/5ML IV SOSY
PREFILLED_SYRINGE | INTRAVENOUS | Status: DC | PRN
  Administered 2024-04-10: 100 mg via INTRAVENOUS

## 2024-04-10 MED ORDER — OXYCODONE HCL 5 MG PO TABS
5.0000 mg | ORAL_TABLET | Freq: Once | ORAL | Status: AC | PRN
  Administered 2024-04-10: 5 mg via ORAL

## 2024-04-10 MED ORDER — FENTANYL CITRATE (PF) 100 MCG/2ML IJ SOLN
INTRAMUSCULAR | Status: DC | PRN
  Administered 2024-04-10: 100 ug via INTRAVENOUS

## 2024-04-10 MED ORDER — OXYCODONE-ACETAMINOPHEN 5-325 MG PO TABS: 1.0000 | ORAL_TABLET | ORAL | 0 refills | Status: DC | PRN

## 2024-04-10 MED ORDER — ONDANSETRON HCL 4 MG/2ML IJ SOLN
INTRAMUSCULAR | Status: DC | PRN
  Administered 2024-04-10: 4 mg via INTRAVENOUS

## 2024-04-10 MED ORDER — ROCURONIUM BROMIDE 100 MG/10ML IV SOLN
INTRAVENOUS | Status: DC | PRN
  Administered 2024-04-10: 10 mg via INTRAVENOUS
  Administered 2024-04-10: 50 mg via INTRAVENOUS

## 2024-04-10 SURGICAL SUPPLY — 32 items
BAG COUNTER SPONGE SURGICOUNT (BAG) ×1 IMPLANT
CHLORAPREP W/TINT 26 (MISCELLANEOUS) ×1 IMPLANT
CLIP APPLIE ROT 10 11.4 M/L (STAPLE) ×1 IMPLANT
DERMABOND ADVANCED .7 DNX12 (GAUZE/BANDAGES/DRESSINGS) ×1 IMPLANT
ELECTRODE REM PT RTRN 9FT ADLT (ELECTROSURGICAL) ×1 IMPLANT
ENDOLOOP SUT PDS II 0 18 (SUTURE) IMPLANT
GLOVE BIO SURGEON STRL SZ7.5 (GLOVE) ×1 IMPLANT
GLOVE BIOGEL PI IND STRL 8 (GLOVE) ×1 IMPLANT
GOWN STRL REUS W/ TWL LRG LVL3 (GOWN DISPOSABLE) ×2 IMPLANT
GOWN STRL REUS W/ TWL XL LVL3 (GOWN DISPOSABLE) ×1 IMPLANT
GRASPER SUT TROCAR 14GX15 (MISCELLANEOUS) ×1 IMPLANT
IRRIGATION SUCT STRKRFLW 2 WTP (MISCELLANEOUS) ×1 IMPLANT
KIT TURNOVER KIT B (KITS) ×1 IMPLANT
NDL HYPO 22X1.5 SAFETY MO (MISCELLANEOUS) ×1 IMPLANT
NDL INSUFFLATION 14GA 120MM (NEEDLE) ×1 IMPLANT
NEEDLE HYPO 22X1.5 SAFETY MO (MISCELLANEOUS) ×1 IMPLANT
NEEDLE INSUFFLATION 14GA 120MM (NEEDLE) ×1 IMPLANT
NS IRRIG 1000ML POUR BTL (IV SOLUTION) ×1 IMPLANT
PACK BASIN DAY SURGERY FS (CUSTOM PROCEDURE TRAY) ×1 IMPLANT
POUCH RETRIEVAL ECOSAC 10 (ENDOMECHANICALS) ×1 IMPLANT
SCISSORS LAP 5X35 DISP (ENDOMECHANICALS) ×1 IMPLANT
SET TUBE SMOKE EVAC HIGH FLOW (TUBING) ×1 IMPLANT
SLEEVE SCD COMPRESS KNEE MED (STOCKING) ×1 IMPLANT
SLEEVE Z-THREAD 5X100MM (TROCAR) ×2 IMPLANT
SUT MNCRL AB 4-0 PS2 18 (SUTURE) ×1 IMPLANT
TOWEL GREEN STERILE FF (TOWEL DISPOSABLE) ×1 IMPLANT
TRAY LAPAROSCOPIC (CUSTOM PROCEDURE TRAY) ×1 IMPLANT
TROCAR 11X100 Z THREAD (TROCAR) ×1 IMPLANT
TROCAR Z-THREAD OPTICAL 5X100M (TROCAR) ×1 IMPLANT
TUBE CONNECTING 20X1/4 (TUBING) ×1 IMPLANT
WARMER LAPAROSCOPE (MISCELLANEOUS) IMPLANT
WATER STERILE IRR 1000ML POUR (IV SOLUTION) ×1 IMPLANT

## 2024-04-10 NOTE — H&P (Signed)
   Admitting Physician: Deward PARAS Misk Galentine  Service: General Surgey  CC: Abdominal pain  Subjective   HPI: Corey Cole is an 33 y.o. male who is here for cholecystectomy  History reviewed. No pertinent past medical history.  History reviewed. No pertinent surgical history.  Family History  Problem Relation Age of Onset   High blood pressure Mother    High Cholesterol Mother    Diabetes Mother    High blood pressure Father    High Cholesterol Father     Social:  reports that he has never smoked. He has never been exposed to tobacco smoke. He does not have any smokeless tobacco history on file. He reports that he does not drink alcohol and does not use drugs.  Allergies:  Allergies  Allergen Reactions   Pork-Derived Products     religious    Medications: Current Outpatient Medications  Medication Instructions   acetaminophen  (TYLENOL ) 650 mg, Every 6 hours PRN   ibuprofen (ADVIL) 200 mg, Every 6 hours PRN   ondansetron  (ZOFRAN -ODT) 4 mg, Oral, Every 8 hours PRN   oxyCODONE  (ROXICODONE ) 5 mg, Oral, Every 4 hours PRN    ROS - all of the below systems have been reviewed with the patient and positives are indicated with bold text General: chills, fever or night sweats Eyes: blurry vision or double vision ENT: epistaxis or sore throat Allergy/Immunology: itchy/watery eyes or nasal congestion Hematologic/Lymphatic: bleeding problems, blood clots or swollen lymph nodes Endocrine: temperature intolerance or unexpected weight changes Breast: new or changing breast lumps or nipple discharge Resp: cough, shortness of breath, or wheezing CV: chest pain or dyspnea on exertion GI: as per HPI GU: dysuria, trouble voiding, or hematuria MSK: joint pain or joint stiffness Neuro: TIA or stroke symptoms Derm: pruritus and skin lesion changes Psych: anxiety and depression  Objective   PE Blood pressure 127/79, pulse (!) 44, temperature (!) 97.2 F (36.2 C), temperature  source Temporal, resp. rate 15, height 5' 8 (1.727 m), weight 94.9 kg, SpO2 100%. Constitutional: NAD; conversant; no deformities Eyes: Moist conjunctiva; no lid lag; anicteric; PERRL Neck: Trachea midline; no thyromegaly Lungs: Normal respiratory effort; no tactile fremitus CV: RRR; no palpable thrills; no pitting edema GI: Abd Soft, nontender; no palpable hepatosplenomegaly MSK: Normal range of motion of extremities; no clubbing/cyanosis Psychiatric: Appropriate affect; alert and oriented x3 Lymphatic: No palpable cervical or axillary lymphadenopathy  No results found for this or any previous visit (from the past 24 hours).  Imaging Orders  No imaging studies ordered today   RUQ US  02/21/24  Multiple gallstones. No further sonographic evidence of acute cholecystitis at this time. No biliary ductal dilatation.   Assessment and Plan   Corey Cole is an 33 y.o. male with Calculus of gallbladder with chronic cholecystitis without obstruction   Mr. Harner has gallstones and symptoms consistent with biliary colic consistent with cholecystitis. I recommended laparoscopic cholecystectomy. We discussed the procedure itself as well as its risk, benefits, and alternatives. We discussed the alternative of avoiding fatty foods. Risk discussed included were not limited to the risk of infection, bleeding, damage nearby structures, postoperative bile leak. After full discussion all questions answered the patient voiced understanding and would like to consider scheduling surgery.    Deward PARAS Foy, MD  [facility] Use [email or web address] to contact on call provider

## 2024-04-10 NOTE — Anesthesia Procedure Notes (Signed)
 Procedure Name: Intubation Date/Time: 04/10/2024 1:42 PM  Performed by: Julieanne Fairy BROCKS, CRNAPre-anesthesia Checklist: Patient identified, Emergency Drugs available, Suction available and Patient being monitored Patient Re-evaluated:Patient Re-evaluated prior to induction Oxygen Delivery Method: Circle system utilized Preoxygenation: Pre-oxygenation with 100% oxygen Induction Type: IV induction Ventilation: Mask ventilation without difficulty Laryngoscope Size: Mac and 3 Tube type: Oral Tube size: 7.5 mm Number of attempts: 1 Airway Equipment and Method: Stylet and Oral airway Placement Confirmation: ETT inserted through vocal cords under direct vision, positive ETCO2 and breath sounds checked- equal and bilateral Secured at: 22 cm Tube secured with: Tape Dental Injury: Teeth and Oropharynx as per pre-operative assessment

## 2024-04-10 NOTE — Transfer of Care (Signed)
 Immediate Anesthesia Transfer of Care Note  Patient: Corey Cole  Procedure(s) Performed: LAPAROSCOPIC CHOLECYSTECTOMY  Patient Location: PACU  Anesthesia Type:General  Level of Consciousness: awake  Airway & Oxygen Therapy: Patient Spontanous Breathing  Post-op Assessment: Report given to RN  Post vital signs: Reviewed  Last Vitals:  Vitals Value Taken Time  BP 113/61 04/10/24 15:00  Temp    Pulse 79 04/10/24 15:00  Resp 22 04/10/24 15:02  SpO2 95 % 04/10/24 15:00  Vitals shown include unfiled device data.  Last Pain:  Vitals:   04/10/24 1242  TempSrc: Temporal  PainSc: 0-No pain         Complications: No notable events documented.

## 2024-04-10 NOTE — Discharge Instructions (Addendum)
 CHOLECYSTECTOMY POST OPERATIVE INSTRUCTIONS  Thinking Clearly  The anesthesia may cause you to feel different for 1 or 2 days. Do not drive, drink alcohol, or make any big decisions for at least 2 days.  Nutrition When you wake up, you will be able to drink small amounts of liquid. If you do not feel sick, you can slowly advance your diet to regular foods. Continue to drink lots of fluids, usually about 8 to 10 glasses per day. Eat a high-fiber diet so you don't strain during bowel movements. High-Fiber Foods Foods high in fiber include beans, bran cereals and whole-grain breads, peas, dried fruit (figs, apricots, and dates), raspberries, blackberries, strawberries, sweet corn, broccoli, baked potatoes with skin, plums, pears, apples, greens, and nuts. Activity Slowly increase your activity. Be sure to get up and walk every hour or so to prevent blood clots. No heavy lifting or strenuous activity for 4 weeks following surgery to prevent hernias at your incision sites It is normal to feel tired. You may need more sleep than usual.  Get your rest but make sure to get up and move around frequently to prevent blood clots and pneumonia.  Work and Return to [employer] can go back to work when you feel well enough. Discuss the timing with your surgeon. You can usually go back to school or work 1 week after an operation. If your work requires heavy lifting or strenuous activity you need to be placed on light duty for 4 weeks following surgery. You can return to gym class, sports or other physical activities 4 weeks after surgery.  Wound Care Always wash your hands before and after touching near your incision site. Do not soak in a bathtub until cleared at your follow up appointment. You may take a shower 24 hours after surgery. A small amount of drainage from the incision is normal. If the drainage is thick and yellow or the site is red, you may have an infection, so call your surgeon. If you  have a drain in one of your incisions, it will be taken out in office when the drainage stops. Steri-Strips will fall off in 7 to 10 days or they will be removed during your first office visit. If you have dermabond glue covering over the incision, allow the glue to flake off on its own. Avoid wearing tight or rough clothing. It may rub your incisions and make it harder for them to heal. Protect the new skin, especially from the sun. The sun can burn and cause darker scarring. Your scar will heal in about 4 to 6 weeks and will become softer and continue to fade over the next year.  The cosmetic appearance of the incisions will improve over the course of the first year after surgery. Sensation around your incision will return in a few weeks or months.  Bowel Movements After intestinal surgery, you may have loose watery stools for several days. If watery diarrhea lasts longer than 3 days, contact your surgeon. Pain medication (narcotics) can cause constipation. Increase the fiber in your diet with high-fiber foods if you are constipated. You can take an over the counter stool softener like Colace to avoid constipation.  Additional over the counter medications can also be used if Colace isn't sufficient (for example, Milk of Magnesia or Miralax).  Pain The amount of pain is different for each person. Some people need only 1 to 3 doses of pain control medication, while others need more. Take alternating doses of tylenol   and ibuprofen around the clock for the first five days following surgery.  This will provide a baseline of pain control and help with inflammation.  Take the narcotic pain medication in addition if needed for severe pain.  Contact Your Surgeon at [phone], if you have: Pain in your right upper abdomen like a gallbladder attack. Pain that will not go away Pain that gets worse A fever of more than 101F (38.3C) Repeated vomiting Swelling, redness, bleeding, or bad-smelling  drainage from your wound site Strong abdominal pain No bowel movement or unable to pass gas for 3 days Watery diarrhea lasting longer than 3 days  Pain Control The goal of pain control is to minimize pain, keep you moving and help you heal. Your surgical team will work with you on your pain plan. Most often a combination of therapies and medications are used to control your pain. You may also be given medication (local anesthetic) at the surgical site. This may help control your pain for several days. Extreme pain puts extra stress on your body at a time when your body needs to focus on healing. Do not wait until your pain has reached a level "10" or is unbearable before telling your doctor or nurse. It is much easier to control pain before it becomes severe. Following a laparoscopic procedure, pain is sometimes felt in the shoulder. This is due to the gas inserted into your abdomen during the procedure. Moving and walking helps to decrease the gas and the right shoulder pain.  Use the guide below for ways to manage your post-operative pain. Learn more by going to facs.org/safepaincontrol.  How Intense Is My Pain Common Therapies to Feel Better       I hardly notice my pain, and it does not interfere with my activities.  I notice my pain and it distracts me, but I can still do activities (sitting up, walking, standing).  Non-Medication Therapies  Ice (in a bag, applied over clothing at the surgical site), elevation, rest, meditation, massage, distraction (music, TV, play) walking and mild exercise Splinting the abdomen with pillows +  Non-Opioid Medications Acetaminophen  (Tylenol ) Non-steroidal anti-inflammatory drugs (NSAIDS) Aspirin, Ibuprofen (Motrin, Advil) Naproxen (Aleve) Take these as needed, when you feel pain. Both acetaminophen  and NSAIDs help to decrease pain and swelling (inflammation).      My pain is hard to ignore and is more noticeable even when I rest.  My  pain interferes with my usual activities.  Non-Medication Therapies  +  Non-Opioid medications  Take on a regular schedule (around-the-clock) instead of as needed. (For example, Tylenol  every 6 hours at 9:00 am, 3:00 pm, 9:00 pm, 3:00 am and Motrin every 6 hours at 12:00 am, 6:00 am, 12:00 pm, 6:00 pm)         I am focused on my pain, and I am not doing my daily activities.  I am groaning in pain, and I cannot sleep. I am unable to do anything.  My pain is as bad as it could be, and nothing else matters.  Non-Medication Therapies  +  Around-the-Clock Non-Opioid Medications  +  Short-acting opioids  Opioids should be used with other medications to manage severe pain. Opioids block pain and give a feeling of euphoria (feel high). Addiction, a serious side effect of opioids, is rare with short-term (a few days) use.  Examples of short-acting opioids include: Tramadol  (Ultram ), Hydrocodone  (Norco, Vicodin), Hydromorphone  (Dilaudid ), Oxycodone  (Oxycontin )     The above directions have been adapted from  the [employer] of [employer] Surgical Patient Education Program.  Please refer to the ACS website if needed: FreakyMates.de.ashx.   Corey Foy, MD [facility] [address], [address], [place], [place]  [address] ?  [address] [address], [place], [place]   [address] [phone] ? [phone] ? FAX [phone] Web site: [email or web address]    Post Anesthesia Home Care Instructions  Activity: Get plenty of rest for the remainder of the day. A responsible individual must stay with you for 24 hours following the procedure.  For the next 24 hours, DO NOT: -Drive a car -Advertising copywriter -Drink alcoholic beverages -Take any medication unless instructed by your physician -Make any legal decisions or sign important papers.  Meals: Start with liquid foods such as gelatin or  soup. Progress to regular foods as tolerated. Avoid greasy, spicy, heavy foods. If nausea and/or vomiting occur, drink only clear liquids until the nausea and/or vomiting subsides. Call your physician if vomiting continues.  Special Instructions/Symptoms: Your throat may feel dry or sore from the anesthesia or the breathing tube placed in your throat during surgery. If this causes discomfort, gargle with warm salt water. The discomfort should disappear within 24 hours.  Can have tylenol  after 7 pm if needed

## 2024-04-10 NOTE — Op Note (Addendum)
 Patient: Corey Cole (04/25/1991, [id number])  Date of Surgery: 04/10/2024  Preoperative Diagnosis: CHOLECYSTITIS   Postoperative Diagnosis: CHOLECYSTITIS   Surgical Procedure: LAPAROSCOPIC CHOLECYSTECTOMY: 52437 (CPT)    Operative Team Members:  Surgeons and Role:    * Azariel Banik, Deward PARAS, MD - Primary  Starleen Eva Barrier, MD - [facility] Resident Assistant  Anesthesiologist: Cleotilde Butler Dade, MD CRNA: Pam Macario BROCKS, CRNA; Julieanne Fairy BROCKS, CRNA   Anesthesia: General   Fluids:  Total I/O In: 200 [I.V.:200] Out: 5 [Blood:5]  Complications: None  Drains:  none   Specimen:  ID Type Source Tests Collected by Time Destination  1 : gallbladder and contents Tissue PATH Other SURGICAL PATHOLOGY Gisel Vipond, Deward PARAS, MD 04/10/2024 1420      Disposition:  PACU - hemodynamically stable.  Plan of Care: Discharge to home after PACU    Indications for Procedure: Corey Cole is a 33 y.o. male who presented with abdominal pain.  History, physical and imaging was concerning for cholecystitis.  Laparoscopic cholecystectomy was recommended for the patient.  The procedure itself, as well as the risks, benefits and alternatives were discussed with the patient.  Risks discussed included but were not limited to the risk of infection, bleeding, damage to nearby structures, need to convert to open procedure, incisional hernia, bile leak, common bile duct injury and the need for additional procedures or surgeries.  With this discussion complete and all questions answered the patient granted consent to proceed.  Findings: Gallstones  Infection status: Patient: Private Patient Elective Case Case: Elective Infection Present At Time Of Surgery (PATOS): Gallstones   Description of Procedure:   On the date stated above, the patient was taken to the operating room suite and placed in supine positioning.  Sequential compression devices were placed on the lower extremities to prevent blood  clots.  General endotracheal anesthesia was induced. Preoperative antibiotics were given.  The patient's abdomen was prepped and draped in the usual sterile fashion.  A time-out was completed verifying the correct patient, procedure, positioning and equipment needed for the case.  We began by anesthetizing the skin with local anesthetic and then making a 5 mm incision just below the umbilicus.  We dissected through the subcutaneous tissues to the fascia.  The fascia was grasped and elevated using a Kocher clamp.  A Veress needle was inserted into the abdomen and the abdomen was insufflated to 15 mmHg.  A 5 mm trocar was inserted in this position under optical guidance and then the abdomen was inspected.  There was no trauma to the underlying viscera with initial trocar placement.  Any abnormal findings, other than inflammation in the right upper quadrant, are listed above in the findings section.  Three additional trocars were placed, one 12 mm trocar in the subxiphoid position, one 5 mm trocar in the midline epigastric area and one 5mm trocar in the right upper quadrant subcostally.  These were placed under direct vision without any trauma to the underlying viscera.    The patient was then placed in head up, left side down positioning.  The gallbladder was identified and dissected free from its attachments to the omentum allowing the duodenum to fall away.  The infundibulum of the gallbladder was dissected free working laterally to medially.  The cystic duct and cystic artery were dissected free from surrounding connective tissue.  The infundibulum of the gallbladder was dissected off the cystic plate.  A critical view of safety was obtained with the cystic duct and cystic  artery being cleared of connective tissues and clearly the only two structures entering into the gallbladder with the liver clearly visible behind.  Clips were then applied to the cystic duct and cystic artery and then these structures were  divided.  A PDS Endoloop was placed on the cystic duct stump. The gallbladder was dissected off the cystic plate, placed in an endocatch bag and removed from the 12 mm subxiphoid port site.  The clips were inspected and appeared effective.  The cystic plate was inspected and hemostasis was obtained using electrocautery.  A suction irrigator was used to clean the operative field.  Attention was turned to closure.  The 12 mm subxiphoid port site was closed using a 0-vicryl suture on a fascial suture passer.  The abdomen was desufflated.  The skin was closed using 4-0 monocryl and dermabond.  All sponge and needle counts were correct at the conclusion of the case.    Deward Foy, MD General, Bariatric, & Minimally Invasive Surgery [facility], [place]

## 2024-04-10 NOTE — Anesthesia Postprocedure Evaluation (Signed)
 Anesthesia Post Note  Patient: Corey Cole  Procedure(s) Performed: LAPAROSCOPIC CHOLECYSTECTOMY     Patient location during evaluation: PACU Anesthesia Type: General Level of consciousness: awake and alert Pain management: pain level controlled Vital Signs Assessment: post-procedure vital signs reviewed and stable Respiratory status: spontaneous breathing, nonlabored ventilation and respiratory function stable Cardiovascular status: blood pressure returned to baseline and stable Postop Assessment: no apparent nausea or vomiting Anesthetic complications: no   No notable events documented.  Last Vitals:  Vitals:   04/10/24 1530 04/10/24 1553  BP: 115/64 109/62  Pulse: (!) 55 (!) 50  Resp: 10 16  Temp:  (!) 36.2 C  SpO2: 96% 96%    Last Pain:  Vitals:   04/10/24 1557  TempSrc:   PainSc: 5                  Butler Levander Pinal

## 2024-04-10 NOTE — Anesthesia Preprocedure Evaluation (Signed)
 Anesthesia Evaluation  Patient identified by MRN, date of birth, ID band Patient awake    Reviewed: Allergy & Precautions, H&P , NPO status , Patient's Chart, lab work & pertinent test results  Airway Mallampati: II  TM Distance: >3 FB Neck ROM: Full    Dental no notable dental hx.    Pulmonary neg pulmonary ROS   Pulmonary exam normal breath sounds clear to auscultation       Cardiovascular negative cardio ROS Normal cardiovascular exam Rhythm:Regular Rate:Normal     Neuro/Psych  Headaches  negative psych ROS   GI/Hepatic Neg liver ROS,GERD  ,,  Endo/Other  negative endocrine ROS    Renal/GU negative Renal ROS  negative genitourinary   Musculoskeletal negative musculoskeletal ROS (+)    Abdominal  (+) + obese  Peds negative pediatric ROS (+)  Hematology negative hematology ROS (+)   Anesthesia Other Findings   Reproductive/Obstetrics negative OB ROS                              Anesthesia Physical Anesthesia Plan  ASA: 2  Anesthesia Plan: General   Post-op Pain Management: Tylenol  PO (pre-op)* and Gabapentin  PO (pre-op)*   Induction: Intravenous  PONV Risk Score and Plan: 2 and Ondansetron , Midazolam  and Treatment may vary due to age or medical condition  Airway Management Planned: Oral ETT  Additional Equipment:   Intra-op Plan:   Post-operative Plan: Extubation in OR  Informed Consent: I have reviewed the patients History and Physical, chart, labs and discussed the procedure including the risks, benefits and alternatives for the proposed anesthesia with the patient or authorized representative who has indicated his/her understanding and acceptance.     Dental advisory given  Plan Discussed with: CRNA  Anesthesia Plan Comments:         Anesthesia Quick Evaluation

## 2024-04-11 ENCOUNTER — Encounter (HOSPITAL_BASED_OUTPATIENT_CLINIC_OR_DEPARTMENT_OTHER): Payer: Self-pay | Admitting: Surgery

## 2024-04-12 LAB — SURGICAL PATHOLOGY

## 2024-08-06 ENCOUNTER — Encounter: Admitting: Family Medicine

## 2024-08-09 ENCOUNTER — Encounter: Admitting: Family Medicine

## 2024-08-21 ENCOUNTER — Ambulatory Visit (INDEPENDENT_AMBULATORY_CARE_PROVIDER_SITE_OTHER): Admitting: Family Medicine

## 2024-08-21 ENCOUNTER — Encounter: Payer: Self-pay | Admitting: Family Medicine

## 2024-08-21 VITALS — BP 116/66 | HR 52 | Temp 98.0°F | Ht 68.0 in | Wt 203.0 lb

## 2024-08-21 DIAGNOSIS — E66811 Obesity, class 1: Secondary | ICD-10-CM | POA: Diagnosis not present

## 2024-08-21 DIAGNOSIS — R1011 Right upper quadrant pain: Secondary | ICD-10-CM | POA: Diagnosis not present

## 2024-08-21 DIAGNOSIS — Z9049 Acquired absence of other specified parts of digestive tract: Secondary | ICD-10-CM | POA: Diagnosis not present

## 2024-08-21 DIAGNOSIS — Z0000 Encounter for general adult medical examination without abnormal findings: Secondary | ICD-10-CM

## 2024-08-21 DIAGNOSIS — Z6830 Body mass index (BMI) 30.0-30.9, adult: Secondary | ICD-10-CM

## 2024-08-21 DIAGNOSIS — E782 Mixed hyperlipidemia: Secondary | ICD-10-CM

## 2024-08-21 DIAGNOSIS — E6609 Other obesity due to excess calories: Secondary | ICD-10-CM | POA: Diagnosis not present

## 2024-08-21 NOTE — Progress Notes (Signed)
 "  Assessment  Assessment/Plan:  Assessment and Plan    Class 1 obesity BMI of 30.9. No acute issues related to obesity discussed. - Recommended lifestyle modifications including heart-healthy diet and physical activity - Screened for diabetes with random insulin, hemoglobin A1c, and fasting CMP - Screened for renal dysfunction with EGFR, urinalysis plus microscopic reflex to culture, and urine microscopic creatinine ratio - Screened for fatty liver disease with LFTs, CMP, and platelets - Screened for endocrine dysfunction with TSH with reflex to T4  Moderate mixed hyperlipidemia Not requiring statin management. Cardiometabolic risk assessment recommended. - Recommended cardiometabolic risk stratification for hyperlipidemia - Rechecked lipid panel, A1 plus B plus ratio, and high sensitivity CRP  Post-cholecystectomy abdominal pain Intermittent abdominal pain post-cholecystectomy, resolved over the past five days. No nausea, vomiting, or other concerning symptoms. Differential includes liver or pancreatic issues, but pain has improved. - Ordered lab work to evaluate liver function including bilirubin and platelets - Advised to report if pain returns for potential ultrasound evaluation  Annual Physical Routine annual physical examination conducted. No major issues reported. - Continue routine health maintenance and screenings          Medications Discontinued During This Encounter  Medication Reason   ondansetron  (ZOFRAN -ODT) 4 MG disintegrating tablet    oxyCODONE  (ROXICODONE ) 5 MG immediate release tablet    acetaminophen  (TYLENOL ) 325 MG tablet    ibuprofen (ADVIL) 200 MG tablet    oxyCODONE -acetaminophen  (PERCOCET) 5-325 MG tablet     Patient Counseling(The following topics were reviewed and/or handout was given):  -Nutrition: Stressed importance of moderation in sodium/caffeine intake, saturated fat and cholesterol, caloric balance, sufficient intake of fresh fruits,  vegetables, and fiber.  -Stressed the importance of regular exercise.   -Substance Abuse: Discussed cessation/primary prevention of tobacco, alcohol, or other drug use; driving or other dangerous activities under the influence; availability of treatment for abuse.   -Injury prevention: Discussed safety belts, safety helmets, smoke detector, smoking near bedding or upholstery.   -Sexuality: Discussed sexually transmitted diseases, partner selection, use of condoms, avoidance of unintended pregnancy and contraceptive alternatives.   -Dental health: Discussed importance of regular tooth brushing, flossing, and dental visits.  -Health maintenance and immunizations reviewed. Please refer to Health maintenance section.  Return in about 1 year (around 08/21/2025) for physical (fasting labs).        Subjective:   Encounter date: 08/21/2024  Chief Complaint  Patient presents with   Annual Exam    Pt presents today for his CPE, Pt is fasting. Pt states he had his gallbladder removed and since the surgery he has been having a pain in the area     Discussed the use of AI scribe software for clinical note transcription with the patient, who gave verbal consent to proceed.  History of Present Illness   Corey Cole is a 33 year old male who presents for an annual physical exam.  Post-cholecystectomy abdominal pain - Intermittent severe pain localized to the area of prior gallbladder removal - Pain present for four months following laparoscopic cholecystectomy in August 2025 - Pain has subsided in the last five days - No associated nausea, vomiting, constipation, or diarrhea - No recent muscle strain or hernia symptoms  Gastrointestinal symptoms - Occasional heartburn - No blood in stool - No constipation or diarrhea - No jaundice  Obesity - BMI 30.9  Hyperlipidemia - Moderate hyperlipidemia - No statin therapy required  General health status - No current medication  use, over-the-counter medicines,  or vitamins - No leg swelling - No chest pain - No significant changes in health since last visit       Hyperlipidemia - Not currently managed with medication. - Last lipid panel with LDL 132 mg/dL in October 7976.   Obesity - Managed with exercise and diet. - Last weight was 209 pounds in August 2025, down from 220 pounds in July.        08/21/2024   11:29 AM 02/14/2024    2:11 PM 08/01/2023    8:58 AM 05/24/2022    2:49 PM  Depression screen PHQ 2/9  Decreased Interest 0 0 0 1  Down, Depressed, Hopeless 0 0 0 0  PHQ - 2 Score 0 0 0 1  Altered sleeping 0  0 0  Tired, decreased energy 0  0 0  Change in appetite 0  0 0  Feeling bad or failure about yourself  0  0 0  Trouble concentrating 0  0 0  Moving slowly or fidgety/restless 0  0 0  Suicidal thoughts 0  0 0  PHQ-9 Score 0  0  1   Difficult doing work/chores Not difficult at all  Not difficult at all Not difficult at all     Data saved with a previous flowsheet row definition       08/21/2024   11:29 AM 08/01/2023    8:58 AM 05/24/2022    2:49 PM  GAD 7 : Generalized Anxiety Score  Nervous, Anxious, on Edge 0 0 0  Control/stop worrying 0 0 0  Worry too much - different things 0 0 0  Trouble relaxing 0 0 0  Restless 0 0 0  Easily annoyed or irritable 0 0 0  Afraid - awful might happen 0 0 0  Total GAD 7 Score 0 0 0  Anxiety Difficulty  Not difficult at all Not difficult at all    There are no preventive care reminders to display for this patient.  PMH:  The following were reviewed and entered/updated in epic: No past medical history on file.  Patient Active Problem List   Diagnosis Date Noted   S/P laparoscopic cholecystectomy 08/21/2024   RUQ pain 08/21/2024   Calculus of gallbladder without cholecystitis without obstruction 02/14/2024   Moderate mixed hyperlipidemia not requiring statin therapy 08/01/2023   Myopia of both eyes 08/01/2023   Chronic nonintractable  headache 08/01/2023   Gastroesophageal reflux disease 08/01/2023   Class 1 obesity due to excess calories with serious comorbidity and body mass index (BMI) of 30.0 to 30.9 in adult 05/27/2022    Past Surgical History:  Procedure Laterality Date   CHOLECYSTECTOMY N/A 04/10/2024   Procedure: LAPAROSCOPIC CHOLECYSTECTOMY;  Surgeon: Lyndel Deward PARAS, MD;  Location: [facility];  Service: General;  Laterality: N/A;    Family History  Problem Relation Age of Onset   High blood pressure Mother    High Cholesterol Mother    Diabetes Mother    High blood pressure Father    High Cholesterol Father     Medications- reviewed and updated Outpatient Medications Prior to Visit  Medication Sig Dispense Refill   acetaminophen  (TYLENOL ) 325 MG tablet Take 650 mg by mouth every 6 (six) hours as needed. (Patient not taking: Reported on 08/21/2024)     ibuprofen (ADVIL) 200 MG tablet Take 200 mg by mouth every 6 (six) hours as needed. (Patient not taking: Reported on 08/21/2024)     ondansetron  (ZOFRAN -ODT) 4 MG disintegrating tablet Take 1 tablet (  4 mg total) by mouth every 8 (eight) hours as needed for nausea or vomiting. (Patient not taking: Reported on 08/21/2024) 20 tablet 0   oxyCODONE  (ROXICODONE ) 5 MG immediate release tablet Take 1 tablet (5 mg total) by mouth every 4 (four) hours as needed for severe pain (pain score 7-10). (Patient not taking: Reported on 08/21/2024) 10 tablet 0   oxyCODONE -acetaminophen  (PERCOCET) 5-325 MG tablet Take 1 tablet by mouth every 4 (four) hours as needed for severe pain (pain score 7-10). (Patient not taking: Reported on 08/21/2024) 20 tablet 0   No facility-administered medications prior to visit.     Allergies[1]  Social History   Socioeconomic History   Marital status: Married    Spouse name: Not on file   Number of children: Not on file   Years of education: Not on file   Highest education level: Not on file  Occupational History    Not on file  Tobacco Use   Smoking status: Never    Passive exposure: Never   Smokeless tobacco: Not on file   Tobacco comments:    Flavored tobacco once per month  Vaping Use   Vaping status: Never Used  Substance and Sexual Activity   Alcohol use: No   Drug use: No   Sexual activity: Not on file  Other Topics Concern   Not on file  Social History Narrative   Not on file   Social Drivers of Health   Tobacco Use: Unknown (04/10/2024)   Patient History    Smoking Tobacco Use: Never    Smokeless Tobacco Use: Unknown    Passive Exposure: Never  Financial Resource Strain: Not on file  Food Insecurity: Not on file  Transportation Needs: Not on file  Physical Activity: Not on file  Stress: Not on file  Social Connections: Not on file  Depression (PHQ2-9): Low Risk (08/21/2024)   Depression (PHQ2-9)    PHQ-2 Score: 0  Alcohol Screen: Not on file  Housing: Not on file  Utilities: Not on file  Health Literacy: Not on file           Objective:  Physical Exam: BP 116/66   Pulse (!) 52   Temp 98 F (36.7 C)   Ht 5' 8 (1.727 m)   Wt 203 lb (92.1 kg)   SpO2 98%   BMI 30.87 kg/m   Body mass index is 30.87 kg/m. Wt Readings from Last 3 Encounters:  08/21/24 203 lb (92.1 kg)  04/10/24 209 lb 3.5 oz (94.9 kg)  02/21/24 220 lb (99.8 kg)    Physical Exam   GENERAL: Alert, cooperative, well developed, no acute distress. HEENT: Normocephalic, normal oropharynx, moist mucous membranes, no scleral icterics,  CHEST: Clear to auscultation bilaterally, no wheezes, rhonchi, or crackles. CARDIOVASCULAR: Normal heart rate and rhythm, S1 and S2 normal without murmurs. ABDOMEN: Soft, non-tender, non-distended, without organomegaly, normal bowel sounds, well healed central abdominal wall scar w/o evidence of hernia EXTREMITIES: No cyanosis or edema, no jaundice  NEUROLOGICAL: Cranial nerves grossly intact, moves all extremities without gross motor or sensory deficit.       Physical Exam      Prior labs:   No results found for this or any previous visit (from the past 2160 hours).  Lab Results  Component Value Date   CHOL 194 05/24/2022   Lab Results  Component Value Date   HDL 37.10 (L) 05/24/2022   Lab Results  Component Value Date   LDLCALC 132 (H) 05/24/2022  Lab Results  Component Value Date   TRIG 122.0 05/24/2022   Lab Results  Component Value Date   CHOLHDL 5 05/24/2022   No results found for: LDLDIRECT  Last metabolic panel Lab Results  Component Value Date   GLUCOSE 93 02/21/2024   NA 136 02/21/2024   K 4.3 02/21/2024   CL 100 02/21/2024   CO2 26 02/21/2024   BUN 13 02/21/2024   CREATININE 1.10 02/21/2024   GFRNONAA >60 02/21/2024   CALCIUM 9.4 02/21/2024   PROT 7.1 02/21/2024   ALBUMIN 4.7 02/21/2024   BILITOT 0.5 02/21/2024   ALKPHOS 62 02/21/2024   AST 19 02/21/2024   ALT 28 02/21/2024   ANIONGAP 11 02/21/2024    Lab Results  Component Value Date   HGBA1C 5.2 05/24/2022    Last CBC Lab Results  Component Value Date   WBC 5.3 02/21/2024   HGB 15.2 02/21/2024   HCT 43.6 02/21/2024   MCV 83.0 02/21/2024   MCH 29.0 02/21/2024   RDW 11.9 02/21/2024   PLT 247 02/21/2024    Lab Results  Component Value Date   TSH 1.47 05/24/2022    No results found for: PSA1, PSA  Last vitamin D  No results found for: MARIEN BOLLS, VD25OH  Lab Results  Component Value Date   BILIRUBINUR NEGATIVE 02/11/2024   PROTEINUR NEGATIVE 02/11/2024   UROBILINOGEN 0.2 12/01/2013   LEUKOCYTESUR NEGATIVE 02/11/2024    No results found for: LABMICR, MICROALBUR   At today's visit, we discussed treatment options, associated risk and benefits, and engage in counseling as needed.  Additionally the following were reviewed: Past medical records, past medical and surgical history, family and social background, as well as relevant laboratory results, imaging findings, and specialty notes, where  applicable.  This message was generated using dictation software, and as a result, it may contain unintentional typos or errors.  Nevertheless, extensive effort was made to accurately convey at the pertinent aspects of the patient visit.    There may have been are other unrelated non-urgent complaints, but due to the busy schedule and the amount of time already spent with him, time does not permit to address these issues at today's visit. Another appointment may have or has been requested to review these additional issues.     Beverley KATHEE Hummer, MD  I,Emily Lagle,acting as a scribe for Beverley KATHEE Hummer, MD.,have documented all relevant documentation on the behalf of Beverley KATHEE Hummer, MD.  LILLETTE Beverley KATHEE Hummer, MD, have reviewed all documentation for this visit. The documentation on 08/21/2024 for the exam, diagnosis, procedures, and orders are all accurate and complete.     [1]  Allergies Allergen Reactions   Porcine (Pork) Protein-Containing Drug Products     religious   "

## 2024-08-21 NOTE — Patient Instructions (Signed)
 It was very nice to see you today!     Return in about 1 year (around 08/21/2025) for physical (fasting labs).   Take care, Arvella Hummer, MD, MS   PLEASE NOTE:  If you had any lab tests, please let us  know if you have not heard back within a few days. You may see your results on mychart before we have a chance to review them but we will give you a call once they are reviewed by us .   If we ordered any referrals today, please let us  know if you have not heard from their office within the next week.   If you had any urgent prescriptions sent in today, please check with the pharmacy within an hour of our visit to make sure the prescription was transmitted appropriately.   Please try these tips to maintain a healthy lifestyle:  Eat at least 3 REAL meals and 1-2 snacks per day.  Aim for no more than 5 hours between eating.  If you eat breakfast, please do so within one hour of getting up.   Each meal should contain half fruits/vegetables, one quarter protein, and one quarter carbs (no bigger than a computer mouse)  Cut down on sweet beverages. This includes juice, soda, and sweet tea.   Drink at least 1 glass of water with each meal and aim for at least 8 glasses per day  Exercise at least 150 minutes every week.

## 2024-08-22 LAB — URINALYSIS W MICROSCOPIC + REFLEX CULTURE
Bacteria, UA: NONE SEEN /HPF
Bilirubin Urine: NEGATIVE
Glucose, UA: NEGATIVE
Hgb urine dipstick: NEGATIVE
Hyaline Cast: NONE SEEN /LPF
Ketones, ur: NEGATIVE
Leukocyte Esterase: NEGATIVE
Nitrites, Initial: NEGATIVE
Protein, ur: NEGATIVE
RBC / HPF: NONE SEEN /HPF (ref 0–2)
Specific Gravity, Urine: 1.014 (ref 1.001–1.035)
Squamous Epithelial / HPF: NONE SEEN /HPF
WBC, UA: NONE SEEN /HPF (ref 0–5)
pH: 6 (ref 5.0–8.0)

## 2024-08-22 LAB — INSULIN, RANDOM: Insulin: 4.7 u[IU]/mL

## 2024-08-22 LAB — APO A1 + B + RATIO
Apolipo. B/A-1 Ratio: 0.9 ratio — ABNORMAL HIGH (ref 0.0–0.7)
Apolipoprotein A-1: 111 mg/dL (ref 101–178)
Apolipoprotein B: 96 mg/dL — ABNORMAL HIGH

## 2024-08-22 LAB — TSH RFX ON ABNORMAL TO FREE T4: TSH: 1.19 u[IU]/mL (ref 0.450–4.500)

## 2024-08-22 LAB — CBC WITH DIFFERENTIAL/PLATELET
Absolute Lymphocytes: 1625 {cells}/uL (ref 850–3900)
Absolute Monocytes: 400 {cells}/uL (ref 200–950)
Basophils Absolute: 32 {cells}/uL (ref 0–200)
Basophils Relative: 0.6 %
Eosinophils Absolute: 49 {cells}/uL (ref 15–500)
Eosinophils Relative: 0.9 %
HCT: 46.2 % (ref 39.4–51.1)
Hemoglobin: 15.3 g/dL (ref 13.2–17.1)
MCH: 28.4 pg (ref 27.0–33.0)
MCHC: 33.1 g/dL (ref 31.6–35.4)
MCV: 85.7 fL (ref 81.4–101.7)
MPV: 11.8 fL (ref 7.5–12.5)
Monocytes Relative: 7.4 %
Neutro Abs: 3294 {cells}/uL (ref 1500–7800)
Neutrophils Relative %: 61 %
Platelets: 273 Thousand/uL (ref 140–400)
RBC: 5.39 Million/uL (ref 4.20–5.80)
RDW: 12.3 % (ref 11.0–15.0)
Total Lymphocyte: 30.1 %
WBC: 5.4 Thousand/uL (ref 3.8–10.8)

## 2024-08-22 LAB — COMPREHENSIVE METABOLIC PANEL WITH GFR
AG Ratio: 2 (calc) (ref 1.0–2.5)
ALT: 18 U/L (ref 9–46)
AST: 15 U/L (ref 10–40)
Albumin: 4.6 g/dL (ref 3.6–5.1)
Alkaline phosphatase (APISO): 54 U/L (ref 36–130)
BUN: 22 mg/dL (ref 7–25)
CO2: 27 mmol/L (ref 20–32)
Calcium: 9.5 mg/dL (ref 8.6–10.3)
Chloride: 102 mmol/L (ref 98–110)
Creat: 0.99 mg/dL (ref 0.60–1.26)
Globulin: 2.3 g/dL (ref 1.9–3.7)
Glucose, Bld: 89 mg/dL (ref 65–99)
Potassium: 4.3 mmol/L (ref 3.5–5.3)
Sodium: 138 mmol/L (ref 135–146)
Total Bilirubin: 0.5 mg/dL (ref 0.2–1.2)
Total Protein: 6.9 g/dL (ref 6.1–8.1)
eGFR: 104 mL/min/1.73m2

## 2024-08-22 LAB — HEMOGLOBIN A1C
Hgb A1c MFr Bld: 5.1 %
Mean Plasma Glucose: 100 mg/dL
eAG (mmol/L): 5.5 mmol/L

## 2024-08-22 LAB — LIPID PANEL
Cholesterol: 152 mg/dL
HDL: 37 mg/dL — ABNORMAL LOW
LDL Cholesterol (Calc): 101 mg/dL — ABNORMAL HIGH
Non-HDL Cholesterol (Calc): 115 mg/dL
Total CHOL/HDL Ratio: 4.1 (calc)
Triglycerides: 59 mg/dL

## 2024-08-22 LAB — HIGH SENSITIVITY CRP: hs-CRP: 1.5 mg/L

## 2024-08-22 LAB — NO CULTURE INDICATED

## 2024-08-26 ENCOUNTER — Ambulatory Visit: Payer: Self-pay | Admitting: Family Medicine
